# Patient Record
Sex: Male | Born: 1985 | Race: Black or African American | Hispanic: No | Marital: Single | Smoking: Current every day smoker
Health system: Southern US, Community
[De-identification: ages and names within clinical notes are randomized; demographics above are authoritative.]

## PROBLEM LIST (undated history)

## (undated) DIAGNOSIS — Z5900 Homelessness unspecified: Secondary | ICD-10-CM

## (undated) DIAGNOSIS — F209 Schizophrenia, unspecified: Secondary | ICD-10-CM

## (undated) DIAGNOSIS — F319 Bipolar disorder, unspecified: Secondary | ICD-10-CM

---

## 2007-07-03 ENCOUNTER — Emergency Department (HOSPITAL_COMMUNITY): Admission: EM | Admit: 2007-07-03 | Discharge: 2007-07-04 | Payer: Self-pay | Admitting: Emergency Medicine

## 2007-07-18 ENCOUNTER — Emergency Department (HOSPITAL_COMMUNITY): Admission: EM | Admit: 2007-07-18 | Discharge: 2007-07-18 | Payer: Self-pay | Admitting: Emergency Medicine

## 2007-08-09 ENCOUNTER — Emergency Department (HOSPITAL_COMMUNITY): Admission: EM | Admit: 2007-08-09 | Discharge: 2007-08-09 | Payer: Self-pay | Admitting: Emergency Medicine

## 2007-08-14 ENCOUNTER — Emergency Department (HOSPITAL_COMMUNITY): Admission: EM | Admit: 2007-08-14 | Discharge: 2007-08-14 | Payer: Self-pay | Admitting: Emergency Medicine

## 2007-08-27 ENCOUNTER — Emergency Department (HOSPITAL_COMMUNITY): Admission: EM | Admit: 2007-08-27 | Discharge: 2007-08-27 | Payer: Self-pay | Admitting: Emergency Medicine

## 2007-09-10 ENCOUNTER — Emergency Department (HOSPITAL_COMMUNITY): Admission: EM | Admit: 2007-09-10 | Discharge: 2007-09-11 | Payer: Self-pay | Admitting: Emergency Medicine

## 2007-09-19 ENCOUNTER — Emergency Department (HOSPITAL_COMMUNITY): Admission: EM | Admit: 2007-09-19 | Discharge: 2007-09-19 | Payer: Self-pay | Admitting: *Deleted

## 2007-09-20 ENCOUNTER — Emergency Department (HOSPITAL_COMMUNITY): Admission: EM | Admit: 2007-09-20 | Discharge: 2007-09-21 | Payer: Self-pay | Admitting: Emergency Medicine

## 2007-09-30 ENCOUNTER — Emergency Department (HOSPITAL_COMMUNITY): Admission: EM | Admit: 2007-09-30 | Discharge: 2007-10-01 | Payer: Self-pay | Admitting: Emergency Medicine

## 2007-10-11 ENCOUNTER — Emergency Department (HOSPITAL_COMMUNITY): Admission: EM | Admit: 2007-10-11 | Discharge: 2007-10-12 | Payer: Self-pay | Admitting: Emergency Medicine

## 2008-01-07 ENCOUNTER — Emergency Department (HOSPITAL_COMMUNITY): Admission: EM | Admit: 2008-01-07 | Discharge: 2008-01-07 | Payer: Self-pay | Admitting: Emergency Medicine

## 2008-02-13 ENCOUNTER — Emergency Department (HOSPITAL_COMMUNITY): Admission: EM | Admit: 2008-02-13 | Discharge: 2008-02-13 | Payer: Self-pay | Admitting: Emergency Medicine

## 2008-10-06 ENCOUNTER — Emergency Department (HOSPITAL_COMMUNITY): Admission: EM | Admit: 2008-10-06 | Discharge: 2008-10-06 | Payer: Self-pay | Admitting: Emergency Medicine

## 2009-03-02 ENCOUNTER — Emergency Department (HOSPITAL_COMMUNITY): Admission: EM | Admit: 2009-03-02 | Discharge: 2009-03-03 | Payer: Self-pay | Admitting: Emergency Medicine

## 2009-05-22 ENCOUNTER — Emergency Department (HOSPITAL_COMMUNITY): Admission: EM | Admit: 2009-05-22 | Discharge: 2009-05-22 | Payer: Self-pay | Admitting: Emergency Medicine

## 2009-07-05 ENCOUNTER — Emergency Department (HOSPITAL_COMMUNITY): Admission: EM | Admit: 2009-07-05 | Discharge: 2009-07-05 | Payer: Self-pay | Admitting: Emergency Medicine

## 2009-09-15 ENCOUNTER — Emergency Department (HOSPITAL_COMMUNITY): Admission: EM | Admit: 2009-09-15 | Discharge: 2009-09-15 | Payer: Self-pay | Admitting: Emergency Medicine

## 2009-10-02 ENCOUNTER — Emergency Department (HOSPITAL_COMMUNITY): Admission: EM | Admit: 2009-10-02 | Discharge: 2009-10-02 | Payer: Self-pay | Admitting: Emergency Medicine

## 2009-10-29 ENCOUNTER — Emergency Department (HOSPITAL_COMMUNITY): Admission: EM | Admit: 2009-10-29 | Discharge: 2009-10-30 | Payer: Self-pay | Admitting: Emergency Medicine

## 2010-03-21 ENCOUNTER — Emergency Department (HOSPITAL_COMMUNITY)
Admission: EM | Admit: 2010-03-21 | Discharge: 2010-03-22 | Disposition: A | Discharge status: Home / Self Care | Payer: Self-pay | Attending: Emergency Medicine | Admitting: Emergency Medicine

## 2010-03-21 DIAGNOSIS — F209 Schizophrenia, unspecified: Secondary | ICD-10-CM | POA: Insufficient documentation

## 2010-03-21 DIAGNOSIS — L259 Unspecified contact dermatitis, unspecified cause: Secondary | ICD-10-CM | POA: Insufficient documentation

## 2010-03-21 DIAGNOSIS — F909 Attention-deficit hyperactivity disorder, unspecified type: Secondary | ICD-10-CM | POA: Insufficient documentation

## 2010-03-21 DIAGNOSIS — R21 Rash and other nonspecific skin eruption: Secondary | ICD-10-CM | POA: Insufficient documentation

## 2010-03-21 DIAGNOSIS — F319 Bipolar disorder, unspecified: Secondary | ICD-10-CM | POA: Insufficient documentation

## 2010-03-21 DIAGNOSIS — E119 Type 2 diabetes mellitus without complications: Secondary | ICD-10-CM | POA: Insufficient documentation

## 2010-03-23 IMAGING — CR DG CHEST 2V
2 series · 2 of 2 positions shown · non-contrast
Comparison: None available.

CLINICAL DATA: Chest cold.  Medical clearance.

CHEST - 2 VIEW

[w chest pa]
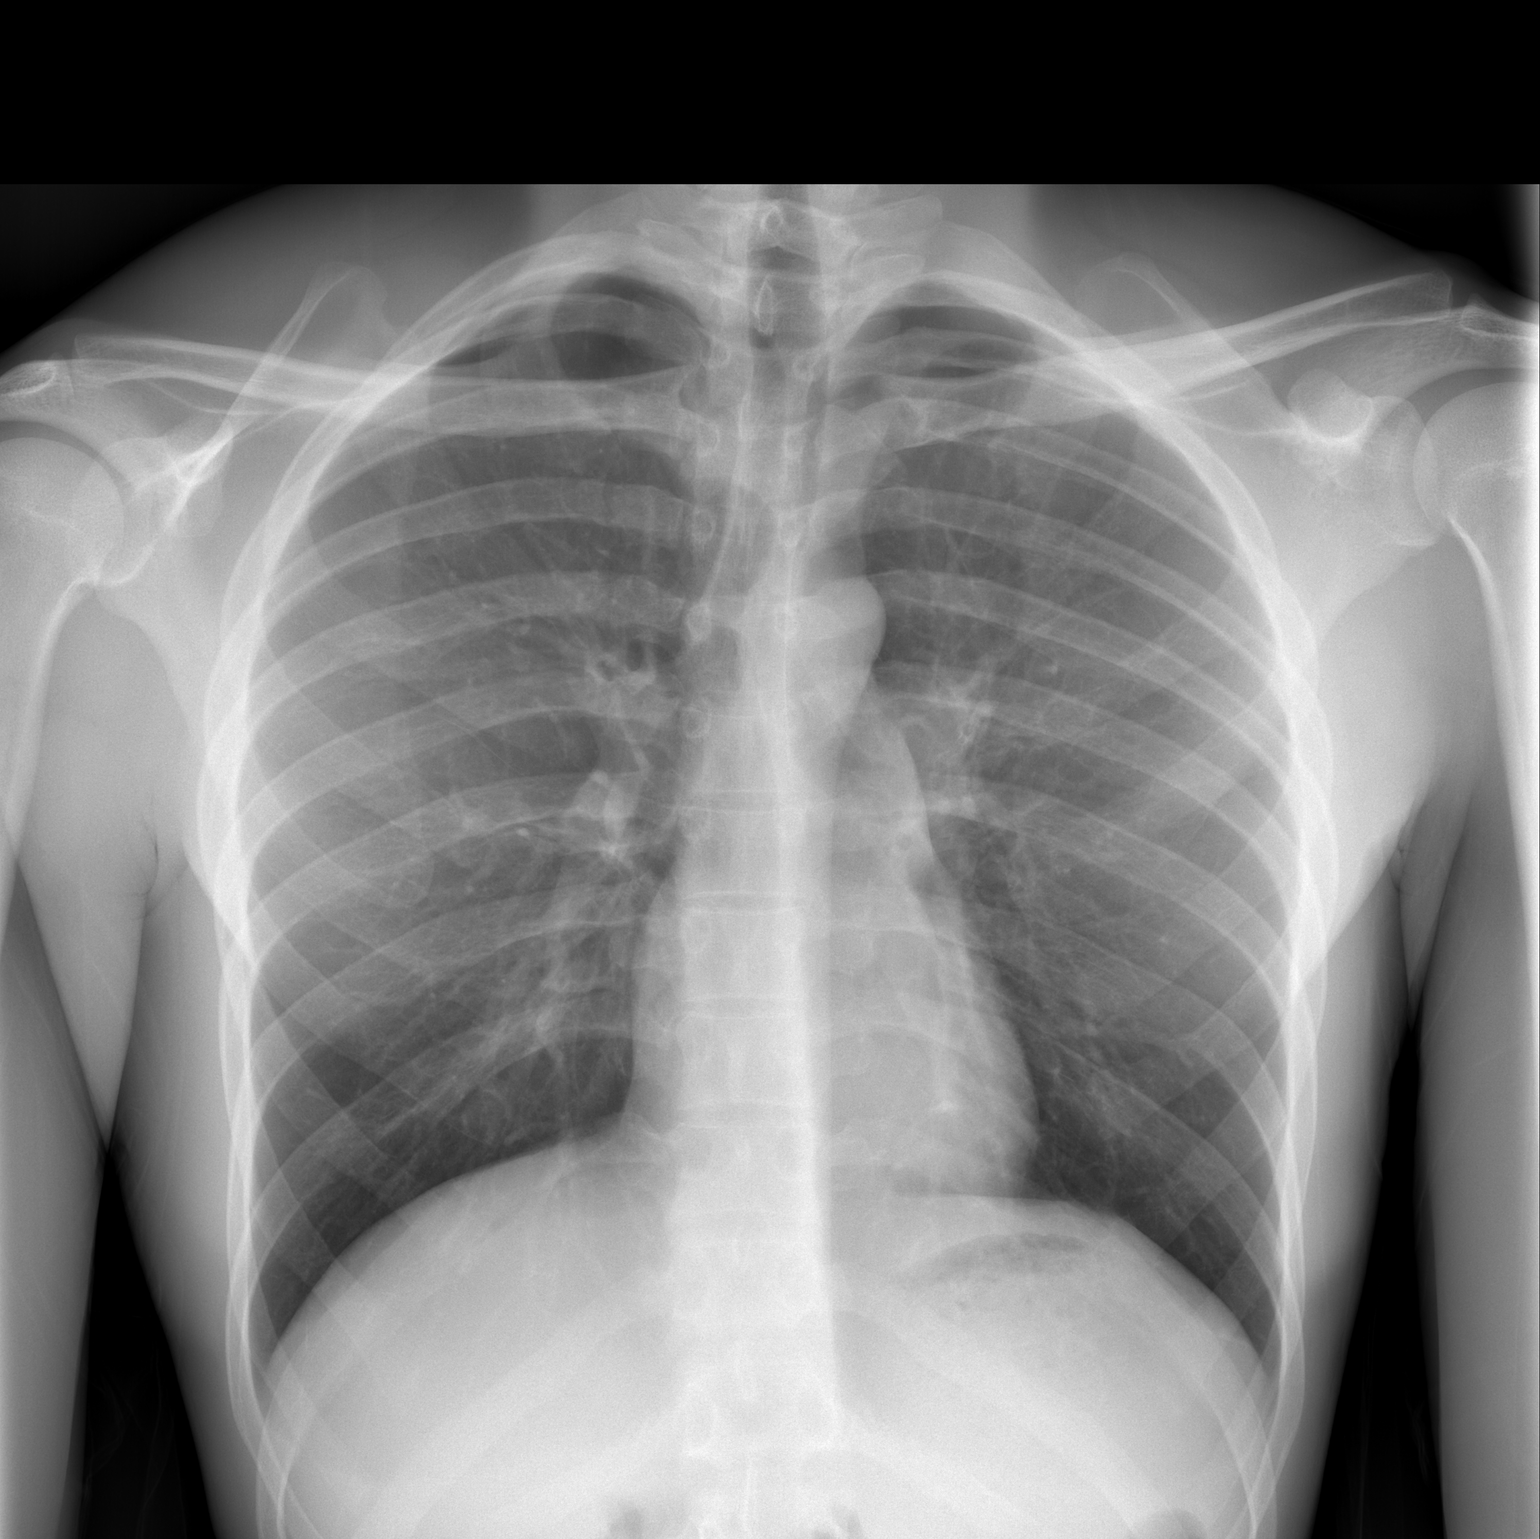

[w chest lat]
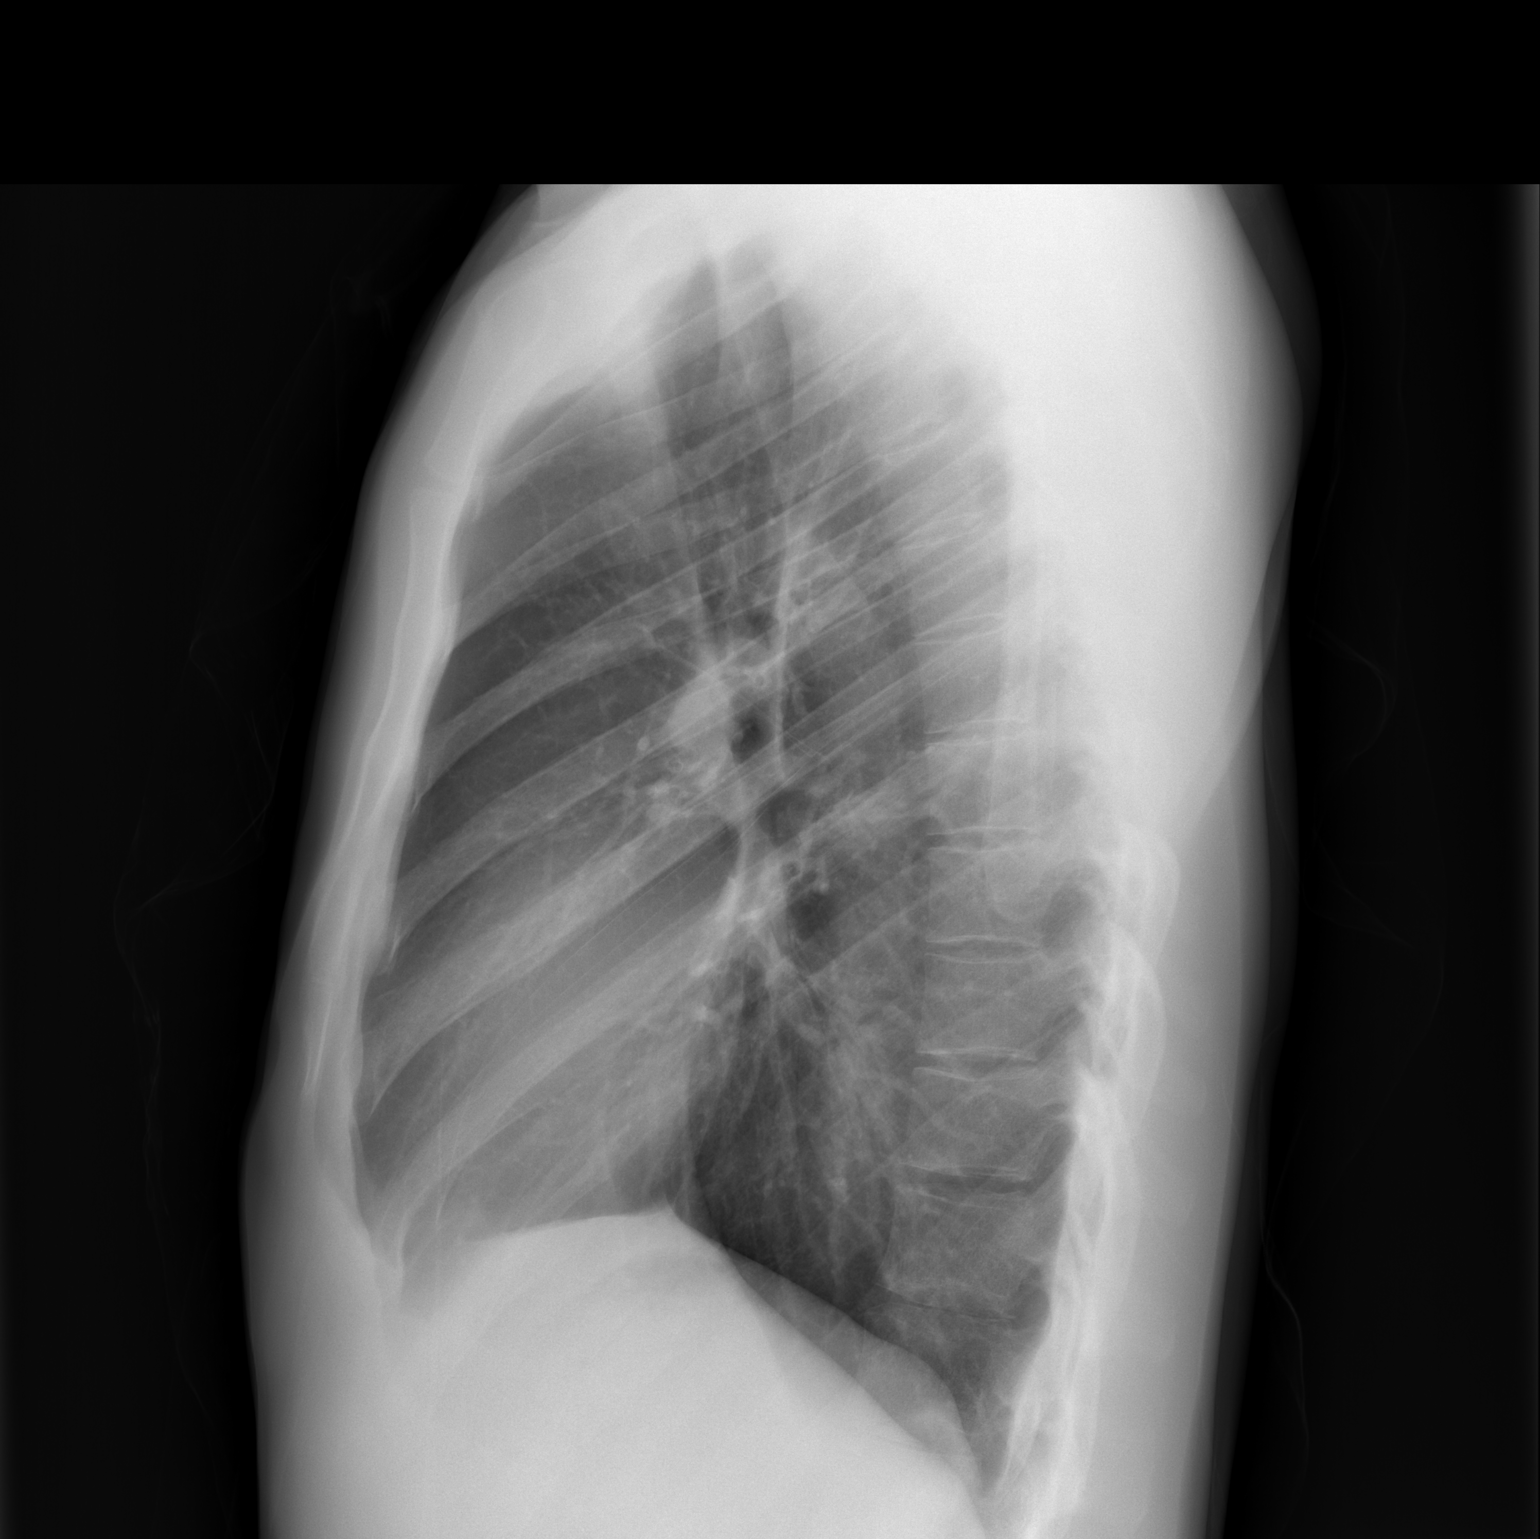

[2 of 2 positions shown; findings below may reference images not displayed]

FINDINGS: Lungs clear.  No pleural effusion.  Heart mediastinum
appear normal.  No bony abnormality.
IMPRESSION: Negative chest.

## 2010-04-18 LAB — GLUCOSE, CAPILLARY: Glucose-Capillary: 82 mg/dL (ref 70–99)

## 2010-04-18 LAB — URINALYSIS, ROUTINE W REFLEX MICROSCOPIC
Ketones, ur: NEGATIVE mg/dL
Specific Gravity, Urine: 1.015 (ref 1.005–1.030)

## 2010-04-22 LAB — GLUCOSE, CAPILLARY: Glucose-Capillary: 81 mg/dL (ref 70–99)

## 2010-10-30 LAB — DIFFERENTIAL
Eosinophils Absolute: 0.1
Eosinophils Relative: 3
Lymphocytes Relative: 16
Lymphs Abs: 0.6 — ABNORMAL LOW
Monocytes Absolute: 0.5
Monocytes Relative: 13 — ABNORMAL HIGH
Neutro Abs: 2.8
Neutrophils Relative %: 67

## 2010-10-30 LAB — CBC
MCHC: 33.5
MCV: 84.7
RDW: 12.8
WBC: 4

## 2010-10-30 LAB — RAPID URINE DRUG SCREEN, HOSP PERFORMED
Amphetamines: NOT DETECTED
Barbiturates: NOT DETECTED
Benzodiazepines: NOT DETECTED
Cocaine: NOT DETECTED

## 2010-10-30 LAB — BASIC METABOLIC PANEL
Chloride: 101
Creatinine, Ser: 1.15

## 2010-10-31 LAB — BASIC METABOLIC PANEL
Creatinine, Ser: 0.97
GFR calc Af Amer: >60
GFR calc non Af Amer: >60
Glucose, Bld: 101 — ABNORMAL HIGH

## 2010-11-07 LAB — GLUCOSE, CAPILLARY: Glucose-Capillary: 106 mg/dL — ABNORMAL HIGH (ref 70–99)

## 2011-02-26 ENCOUNTER — Emergency Department (HOSPITAL_COMMUNITY)
Admission: EM | Admit: 2011-02-26 | Discharge: 2011-02-26 | Disposition: A | Discharge status: Home / Self Care | Payer: Self-pay | Attending: Emergency Medicine | Admitting: Emergency Medicine

## 2011-02-26 ENCOUNTER — Encounter (HOSPITAL_COMMUNITY): Payer: Self-pay

## 2011-02-26 DIAGNOSIS — J069 Acute upper respiratory infection, unspecified: Secondary | ICD-10-CM | POA: Insufficient documentation

## 2011-02-26 DIAGNOSIS — F319 Bipolar disorder, unspecified: Secondary | ICD-10-CM | POA: Insufficient documentation

## 2011-02-26 DIAGNOSIS — J3489 Other specified disorders of nose and nasal sinuses: Secondary | ICD-10-CM | POA: Insufficient documentation

## 2011-02-26 DIAGNOSIS — R05 Cough: Secondary | ICD-10-CM | POA: Insufficient documentation

## 2011-02-26 DIAGNOSIS — Z8659 Personal history of other mental and behavioral disorders: Secondary | ICD-10-CM | POA: Insufficient documentation

## 2011-02-26 DIAGNOSIS — R059 Cough, unspecified: Secondary | ICD-10-CM | POA: Insufficient documentation

## 2011-02-26 DIAGNOSIS — M25519 Pain in unspecified shoulder: Secondary | ICD-10-CM | POA: Insufficient documentation

## 2011-02-26 DIAGNOSIS — E119 Type 2 diabetes mellitus without complications: Secondary | ICD-10-CM | POA: Insufficient documentation

## 2011-02-26 DIAGNOSIS — R6889 Other general symptoms and signs: Secondary | ICD-10-CM | POA: Insufficient documentation

## 2011-02-26 HISTORY — DX: Schizophrenia, unspecified: F20.9

## 2011-02-26 HISTORY — DX: Bipolar disorder, unspecified: F31.9

## 2011-02-26 MED ORDER — TRAMADOL HCL 50 MG PO TABS: 50.0000 mg | ORAL_TABLET | Freq: Four times a day (QID) | ORAL | Status: AC | PRN

## 2011-02-26 NOTE — ED Notes (Signed)
Pt also reports L clavicle and sternal pain "all my life"  Pt reports bones were fractured "a long time ago" but does not  Remember injury.

## 2011-02-26 NOTE — ED Notes (Signed)
Pt presents with 2 week h/o productive cough with yellow phlegm.  Pt reports nasal congestion with same.  -sore throat

## 2011-02-26 NOTE — ED Provider Notes (Signed)
History     CSN: [id number]  Arrival date & time 02/26/11  1055   First MD Initiated Contact with Patient 02/26/11 1128      Chief Complaint  Patient presents with  . Cough    (Consider location/radiation/quality/duration/timing/severity/associated sxs/prior treatment) HPI Comments: Patient presents with a one to two-week history of runny nose and nasal congestion. He says he has a mild cough but nonproductive denies any chest congestion. Denies a fevers or chills denies any difficulty swallowing. Denies a nausea vomiting. He has not taken anything for this in the past. He'll soak complains of pain to his left clavicle and sternum. He says he fractured this is a small child and is chronically in pain. Denies any recent injuries to this. Denies any shortness of breath or other chest pain.  Patient is a 26 y.o. male presenting with cough. The history is provided by the patient.  Cough Associated symptoms include rhinorrhea. Pertinent negatives include no chest pain, no chills, no headaches and no shortness of breath.    Past Medical History  Diagnosis Date  . Diabetes mellitus   . Bipolar 1 disorder   . Schizophrenic disorder     History reviewed. No pertinent past surgical history.  No family history on file.  History  Substance Use Topics  . Smoking status: Current Everyday Smoker -- 1.0 packs/day  . Smokeless tobacco: Not on file  . Alcohol Use: Yes      Review of Systems  Constitutional: Negative for fever, chills, diaphoresis and fatigue.  HENT: Positive for congestion and rhinorrhea. Negative for sneezing.   Eyes: Negative.   Respiratory: Positive for cough. Negative for chest tightness and shortness of breath.   Cardiovascular: Negative for chest pain and leg swelling.  Gastrointestinal: Negative for nausea, vomiting, abdominal pain, diarrhea and blood in stool.  Genitourinary: Negative for frequency, hematuria, flank pain and difficulty urinating.    Musculoskeletal: Negative for back pain and arthralgias.  Skin: Negative for rash.  Neurological: Negative for dizziness, speech difficulty, weakness, numbness and headaches.    Allergies  Penicillins cross reactors  Home Medications   Current Outpatient Rx  Name Route Sig Dispense Refill  . TRAMADOL HCL 50 MG PO TABS Oral Take 1 tablet (50 mg total) by mouth every 6 (six) hours as needed for pain. 15 tablet 0    BP 158/63  Pulse 86  Temp(Src) 97.6 F (36.4 C) (Oral)  Resp 20  SpO2 99%  Physical Exam  Constitutional: He is oriented to person, place, and time. He appears well-developed and well-nourished.  HENT:  Head: Normocephalic and atraumatic.  Eyes: Pupils are equal, round, and reactive to light.  Neck: Normal range of motion. Neck supple.  Cardiovascular: Normal rate, regular rhythm and normal heart sounds.   Pulmonary/Chest: Effort normal and breath sounds normal. No respiratory distress. He has no wheezes. He has no rales. He exhibits no tenderness.       Mild deformity to the left proximal clavicle area. No swelling or erythema is noted this area  Abdominal: Soft. Bowel sounds are normal. There is no tenderness. There is no rebound and no guarding.  Musculoskeletal: Normal range of motion. He exhibits no edema.  Lymphadenopathy:    He has no cervical adenopathy.  Neurological: He is alert and oriented to person, place, and time.  Skin: Skin is warm and dry. No rash noted.  Psychiatric: He has a normal mood and affect.    ED Course  Procedures (including critical care time)  Labs Reviewed - No data to display No results found.   1. URI (upper respiratory infection)       MDM  Patient with URI symptoms. No evidence of pneumonia. Has chronic pain to his left clavicle. Will refer him to [facility]. We'll give him a short course of Ultram.        Rolan Bucco, MD 02/26/11 1135

## 2012-08-22 ENCOUNTER — Encounter (HOSPITAL_COMMUNITY): Payer: Self-pay | Admitting: *Deleted

## 2012-08-22 ENCOUNTER — Emergency Department (HOSPITAL_COMMUNITY): Payer: Self-pay

## 2012-08-22 ENCOUNTER — Inpatient Hospital Stay (HOSPITAL_COMMUNITY)
Admission: EM | Admit: 2012-08-22 | Discharge: 2012-08-23 | DRG: 894 | Discharge status: Against Medical Advice | Payer: MEDICAID | Attending: Internal Medicine | Admitting: Internal Medicine

## 2012-08-22 DIAGNOSIS — I498 Other specified cardiac arrhythmias: Secondary | ICD-10-CM | POA: Diagnosis present

## 2012-08-22 DIAGNOSIS — E119 Type 2 diabetes mellitus without complications: Secondary | ICD-10-CM | POA: Diagnosis present

## 2012-08-22 DIAGNOSIS — F17200 Nicotine dependence, unspecified, uncomplicated: Secondary | ICD-10-CM | POA: Diagnosis present

## 2012-08-22 DIAGNOSIS — R4182 Altered mental status, unspecified: Secondary | ICD-10-CM

## 2012-08-22 DIAGNOSIS — F1910 Other psychoactive substance abuse, uncomplicated: Secondary | ICD-10-CM

## 2012-08-22 DIAGNOSIS — R7989 Other specified abnormal findings of blood chemistry: Secondary | ICD-10-CM

## 2012-08-22 DIAGNOSIS — F1410 Cocaine abuse, uncomplicated: Principal | ICD-10-CM

## 2012-08-22 DIAGNOSIS — F319 Bipolar disorder, unspecified: Secondary | ICD-10-CM | POA: Diagnosis present

## 2012-08-22 DIAGNOSIS — F209 Schizophrenia, unspecified: Secondary | ICD-10-CM | POA: Diagnosis present

## 2012-08-22 DIAGNOSIS — I440 Atrioventricular block, first degree: Secondary | ICD-10-CM | POA: Diagnosis present

## 2012-08-22 DIAGNOSIS — Z5900 Homelessness unspecified: Secondary | ICD-10-CM

## 2012-08-22 DIAGNOSIS — F1210 Cannabis abuse, uncomplicated: Secondary | ICD-10-CM | POA: Diagnosis present

## 2012-08-22 DIAGNOSIS — I201 Angina pectoris with documented spasm: Secondary | ICD-10-CM | POA: Diagnosis present

## 2012-08-22 LAB — COMPREHENSIVE METABOLIC PANEL
ALT: 24 U/L (ref 0–53)
CO2: 28 mEq/L (ref 19–32)
Calcium: 8.7 mg/dL (ref 8.4–10.5)
Chloride: 107 mEq/L (ref 96–112)
Creatinine, Ser: 1.24 mg/dL (ref 0.50–1.35)
GFR calc Af Amer: >90 mL/min (ref >90)
Glucose, Bld: 92 mg/dL (ref 70–99)
Potassium: 3.7 mEq/L (ref 3.5–5.1)
Sodium: 141 mEq/L (ref 135–145)
Total Bilirubin: 0.2 mg/dL — ABNORMAL LOW (ref 0.3–1.2)
Total Protein: 6 g/dL (ref 6.0–8.3)

## 2012-08-22 LAB — URINE MICROSCOPIC-ADD ON

## 2012-08-22 LAB — TROPONIN I: Troponin I: 0.44 ng/mL (ref <0.30)

## 2012-08-22 LAB — CBC WITH DIFFERENTIAL/PLATELET
Basophils Relative: 0 % (ref 0–1)
Eosinophils Absolute: 0.1 10*3/uL (ref 0.0–0.7)
Lymphs Abs: 1.1 10*3/uL (ref 0.7–4.0)
MCV: 87.2 fL (ref 78.0–100.0)
Neutrophils Relative %: 70 % (ref 43–77)
RBC: 3.83 MIL/uL — ABNORMAL LOW (ref 4.22–5.81)

## 2012-08-22 LAB — ETHANOL: Alcohol, Ethyl (B): <11 mg/dL (ref 0–11)

## 2012-08-22 LAB — URINALYSIS, ROUTINE W REFLEX MICROSCOPIC
Glucose, UA: NEGATIVE mg/dL
Leukocytes, UA: NEGATIVE
Nitrite: NEGATIVE
Specific Gravity, Urine: 1.031 — ABNORMAL HIGH (ref 1.005–1.030)
pH: 6.5 (ref 5.0–8.0)

## 2012-08-22 LAB — RAPID URINE DRUG SCREEN, HOSP PERFORMED
Cocaine: POSITIVE — AB
Tetrahydrocannabinol: POSITIVE — AB

## 2012-08-22 LAB — SALICYLATE LEVEL: Salicylate Lvl: <2 mg/dL — ABNORMAL LOW (ref 2.8–20.0)

## 2012-08-22 LAB — ACETAMINOPHEN LEVEL: Acetaminophen (Tylenol), Serum: <15 ug/mL (ref 10–30)

## 2012-08-22 MED ORDER — ASPIRIN 300 MG RE SUPP
300.0000 mg | RECTAL | Status: AC
  Administered 2012-08-22: 300 mg via RECTAL
  Filled 2012-08-22: qty 1

## 2012-08-22 MED ORDER — NALOXONE HCL 0.4 MG/ML IJ SOLN
0.4000 mg | Freq: Once | INTRAMUSCULAR | Status: AC
  Administered 2012-08-22: 0.4 mg via INTRAVENOUS
  Filled 2012-08-22: qty 1

## 2012-08-22 MED ORDER — SODIUM CHLORIDE 0.9 % IV SOLN
INTRAVENOUS | Status: AC
  Administered 2012-08-22: 23:00:00 via INTRAVENOUS

## 2012-08-22 MED ORDER — SODIUM CHLORIDE 0.9 % IJ SOLN: 3.0000 mL | Freq: Two times a day (BID) | INTRAMUSCULAR | Status: DC

## 2012-08-22 MED ORDER — ASPIRIN 325 MG PO TABS
325.0000 mg | ORAL_TABLET | Freq: Every day | ORAL | Status: DC
  Filled 2012-08-22: qty 1

## 2012-08-22 MED ORDER — SODIUM CHLORIDE 0.9 % IV SOLN
INTRAVENOUS | Status: DC
  Administered 2012-08-23: 07:00:00 via INTRAVENOUS

## 2012-08-22 MED ORDER — HEPARIN SODIUM (PORCINE) 5000 UNIT/ML IJ SOLN
5000.0000 [IU] | Freq: Three times a day (TID) | INTRAMUSCULAR | Status: DC
  Administered 2012-08-22 – 2012-08-23 (×2): 5000 [IU] via SUBCUTANEOUS
  Filled 2012-08-22 (×5): qty 1

## 2012-08-22 MED ORDER — SODIUM CHLORIDE 0.9 % IV BOLUS (SEPSIS)
1000.0000 mL | Freq: Once | INTRAVENOUS | Status: AC
  Administered 2012-08-22: 1000 mL via INTRAVENOUS

## 2012-08-22 NOTE — H&P (Addendum)
[facility] History and Physical  Stephen Hutchinson WUJ:[id number] DOB: 05-Apr-1985 DOA: 08/22/2012  Referring physician: ED PCP: Default, Provider, MD   Chief Complaint: AMS  HPI: Stephen Hutchinson is a 27 y.o. male who presents to the ED with AMS.  He has a history of BPD and schizophrenia as well as polysubstance abuse.  He is homeless, was on the side of the street when EMS picked him up.  He told EMS that he had some headache but appeared to be very intoxicated.  Due to intoxication he is not able to give much more history at this time.  In the ED labs were remarkable for an elevated troponin on the i-stat, confirmed by lab draws.  Remainder of his work up was remarkable for positive THC and cocaine in the urine, and otherwise unremarkable.  Narcan x2 did not improve mental status.  Hospitalist has been asked to admit.  Review of Systems: Patient denies SOB, denies CP, just states he feels sleepy "because he hasnt slept in a month", very difficult to get much more out of the patient in ROS at this point.  Past Medical History  Diagnosis Date  . Diabetes mellitus   . Bipolar 1 disorder   . Schizophrenic disorder    History reviewed. No pertinent past surgical history. Social History:  reports that he has been smoking.  He does not have any smokeless tobacco history on file. He reports that  drinks alcohol. He reports that he does not use illicit drugs.   Allergies  Allergen Reactions  . Penicillins Cross Reactors Swelling    History reviewed. No pertinent family history.   Prior to Admission medications   Not on File   Physical Exam: Filed Vitals:   08/22/12 1853  BP: 123/80  Pulse: 60  Temp: 98.5 F (36.9 C)  TempSrc: Oral  Resp: 16  SpO2: 100%    General:  NAD, sleepy, wakes up to voice but falls back asleep very quickly Eyes: PEERLA EOMI ENT: mucous membranes moist Neck: supple w/o JVD Cardiovascular: RRR w/o MRG Respiratory: CTA B Abdomen: soft, nt,  nd, bs+ Skin: no rash nor lesion Musculoskeletal: MAE, full ROM all 4 extremities Psychiatric: normal tone and affect Neurologic: AAOx3, grossly non-focal   Labs on Admission:  Basic Metabolic Panel:  Recent Labs Lab 08/22/12 1930  NA 141  K 3.7  CL 107  CO2 28  GLUCOSE 92  BUN 9  CREATININE 1.24  CALCIUM 8.7   Liver Function Tests:  Recent Labs Lab 08/22/12 1930  AST 33  ALT 24  ALKPHOS 57  BILITOT 0.2*  PROT 6.0  ALBUMIN 3.3*   No results found for this basename: LIPASE, AMYLASE,  in the last 168 hours No results found for this basename: AMMONIA,  in the last 168 hours CBC:  Recent Labs Lab 08/22/12 1930  WBC 6.0  NEUTROABS 4.2  HGB 10.6*  HCT 33.4*  MCV 87.2  PLT 186   Cardiac Enzymes:  Recent Labs Lab 08/22/12 1930  TROPONINI 0.44*    BNP (last 3 results) No results found for this basename: PROBNP,  in the last 8760 hours CBG: No results found for this basename: GLUCAP,  in the last 168 hours  Radiological Exams on Admission: Dg Chest 1 View  08/22/2012   *RADIOLOGY REPORT*  Clinical Data: Altered mental status.  Diabetes.  CHEST - 1 VIEW  Comparison: 09/30/2007.  Findings:  Cardiopericardial silhouette within normal limits. Mediastinal contours normal. Trachea midline.  No airspace disease  or effusion. Monitoring leads are projected over the chest.  IMPRESSION: No active cardiopulmonary disease.   Original Report Authenticated By: Andreas Newport, M.D.   Ct Head Wo Contrast  08/22/2012   *RADIOLOGY REPORT*  Clinical Data: Altered mental status  CT HEAD WITHOUT CONTRAST  Technique:  Contiguous axial images were obtained from the base of the skull through the vertex without contrast. Study was obtained within 24 hours of patient arrival at the emergency department.  Comparison: December 10, 2007  Findings: Ventricles are normal in size and configuration.  There is no mass, hemorrhage, extra-axial fluid collection, or midline shift.  The gray-white  compartments are normal.  No acute infarct apparent.  Bony calvarium appears intact.  The mastoid air cells are clear.  There is minimal mucosal thickening in both maxillary antra.  IMPRESSION: Minimal maxillary sinus disease bilaterally.  Study otherwise unremarkable.   Original Report Authenticated By: Bretta Bang, M.D.    EKG: Independently reviewed.  Assessment/Plan Principal Problem:   Elevated troponin Active Problems:   Polysubstance abuse   Cocaine abuse   Altered mental status   1. Elevated troponin - checking serial troponins, admitting patient over to [facility], cardiology consulted and will see patient later when he gets over to [facility], likely secondary to ischemia from cocaine use.  Clinically patient states he isnt having SOB or CP at this time (though he does admit he gets SOB "sometimes"), 2D echo ordered, ASA 325 ordered, no heparin per Cards, no beta blocker since he is on cocaine, lipid panel ordered for tomorrow AM to see if statin indicated.  No CP at this time so wont order NTG.  Likewise dissection unlikely in absence of CP and normal BPs. 2. AMS - likely secondary to polysubstance abuse, unclear however which substance is causing the AMS, the AMS findings are almost classic for an opiod, but this was negative in his UDS and patient had no response to narcan given twice in ED.  CT head negative, will observe patient for now and see if this resolves overnight.  Patient states he is tired because he "hasnt slept in a month" I suppose that this is also possible given his h/o BPD type 1. 3. Question of DM - I see this on patients PMH but dont see that he is on ANY home meds for this, additionally his BGL has never been elevated on labs here in the past, have ordered Q4H BGL checks for now in the SDU and will add insulin coverage if he elevates his BGL.    Code Status: Full Code (must indicate code status--if unknown or must be presumed, indicate so) Family Communication: No  family in room (indicate person spoken with, if applicable, with phone number if by telephone) Disposition Plan: Admit to inpatient (indicate anticipated LOS)  Time spent: 70 min  GARDNER, JARED M. [facility] Pager [phone]  If 7PM-7AM, please contact night-coverage [email or web address] Password [facility] 08/22/2012, 11:12 PM

## 2012-08-22 NOTE — ED Provider Notes (Signed)
History    CSN: [id number] Arrival date & time 08/22/12  1847  First MD Initiated Contact with Patient 08/22/12 1851     Chief Complaint  Patient presents with  . Headache   (Consider location/radiation/quality/duration/timing/severity/associated sxs/prior Treatment) The history is provided by the patient and the EMS personnel. History limited by: intoxication.  Badr Piedra is a 27 y.o. male hx of DM, bipolar here with AMS. He is homeless and was on the side of the street. He told EMS that he had some headache but appeared very intoxicated. Denies alcohol use but smokes and does some drugs but is unable to say what drugs he took. He is unable to characterize his headache since he is intoxicated.   Level V caveat- AMS Past Medical History  Diagnosis Date  . Diabetes mellitus   . Bipolar 1 disorder   . Schizophrenic disorder    History reviewed. No pertinent past surgical history. History reviewed. No pertinent family history. History  Substance Use Topics  . Smoking status: Current Every Day Smoker -- 1.00 packs/day  . Smokeless tobacco: Not on file  . Alcohol Use: Yes    Review of Systems  Unable to perform ROS: Mental status change  Neurological: Positive for headaches.    Allergies  Penicillins cross reactors  Home Medications  No current outpatient prescriptions on file. BP 123/80  Pulse 60  Temp(Src) 98.5 F (36.9 C) (Oral)  Resp 16  SpO2 100% Physical Exam  Nursing note and vitals reviewed. Constitutional:  Tired, wakes up to answer questions. Smells of alcohol   HENT:  Head: Normocephalic and atraumatic.  Mouth/Throat: Oropharynx is clear and moist.  Eyes: Pupils are equal, round, and reactive to light.  Pupils small but reactive. EOMI  Neck: Normal range of motion. Neck supple.  Cardiovascular: Normal rate, regular rhythm and normal heart sounds.   Pulmonary/Chest: Effort normal and breath sounds normal. No respiratory distress. He has no  wheezes. He has no rales.  Abdominal: Soft. Bowel sounds are normal. He exhibits no distension. There is no tenderness. There is no rebound and no guarding.  Musculoskeletal: Normal range of motion.  Neurological:  Tired, wakes up to questioning. Moving all extremities. Difficult to follow commands   Skin: Skin is warm and dry.  Psychiatric:  Unable     ED Course  Procedures (including critical care time)  CRITICAL CARE Performed by: Silverio Lay, DAVID   Total critical care time:  30 min   Critical care time was exclusive of separately billable procedures and treating other patients.  Critical care was necessary to treat or prevent imminent or life-threatening deterioration.  Critical care was time spent personally by me on the following activities: development of treatment plan with patient and/or surrogate as well as nursing, discussions with consultants, evaluation of patient's response to treatment, examination of patient, obtaining history from patient or surrogate, ordering and performing treatments and interventions, ordering and review of laboratory studies, ordering and review of radiographic studies, pulse oximetry and re-evaluation of patient's condition.   Labs Reviewed  CBC WITH DIFFERENTIAL - Abnormal; Notable for the following:    RBC 3.83 (*)    Hemoglobin 10.6 (*)    HCT 33.4 (*)    All other components within normal limits  COMPREHENSIVE METABOLIC PANEL - Abnormal; Notable for the following:    Albumin 3.3 (*)    Total Bilirubin 0.2 (*)    GFR calc non Af Amer 78 (*)    All other components within normal  limits  SALICYLATE LEVEL - Abnormal; Notable for the following:    Salicylate Lvl <2.0 (*)    All other components within normal limits  URINE RAPID DRUG SCREEN (HOSP PERFORMED) - Abnormal; Notable for the following:    Cocaine POSITIVE (*)    Tetrahydrocannabinol POSITIVE (*)    All other components within normal limits  TROPONIN I - Abnormal; Notable for the  following:    Troponin I 0.44 (*)    All other components within normal limits  URINALYSIS, ROUTINE W REFLEX MICROSCOPIC - Abnormal; Notable for the following:    APPearance CLOUDY (*)    Specific Gravity, Urine 1.031 (*)    Protein, ur 30 (*)    All other components within normal limits  URINE MICROSCOPIC-ADD ON - Abnormal; Notable for the following:    Bacteria, UA FEW (*)    Casts HYALINE CASTS (*)    Crystals CA OXALATE CRYSTALS (*)    All other components within normal limits  POCT I-STAT TROPONIN I - Abnormal; Notable for the following:    Troponin i, poc 0.16 (*)    All other components within normal limits  ETHANOL  ACETAMINOPHEN LEVEL   Dg Chest 1 View  08/22/2012   *RADIOLOGY REPORT*  Clinical Data: Altered mental status.  Diabetes.  CHEST - 1 VIEW  Comparison: 09/30/2007.  Findings:  Cardiopericardial silhouette within normal limits. Mediastinal contours normal. Trachea midline.  No airspace disease or effusion. Monitoring leads are projected over the chest.  IMPRESSION: No active cardiopulmonary disease.   Original Report Authenticated By: Andreas Newport, M.D.   Ct Head Wo Contrast  08/22/2012   *RADIOLOGY REPORT*  Clinical Data: Altered mental status  CT HEAD WITHOUT CONTRAST  Technique:  Contiguous axial images were obtained from the base of the skull through the vertex without contrast. Study was obtained within 24 hours of patient arrival at the emergency department.  Comparison: December 10, 2007  Findings: Ventricles are normal in size and configuration.  There is no mass, hemorrhage, extra-axial fluid collection, or midline shift.  The gray-white compartments are normal.  No acute infarct apparent.  Bony calvarium appears intact.  The mastoid air cells are clear.  There is minimal mucosal thickening in both maxillary antra.  IMPRESSION: Minimal maxillary sinus disease bilaterally.  Study otherwise unremarkable.   Original Report Authenticated By: Bretta Bang, M.D.   No  diagnosis found.   Date: 08/22/2012  Rate: 67  Rhythm: sinus arrhythmia  QRS Axis: normal  Intervals: normal  ST/T Wave abnormalities: nonspecific ST changes  Conduction Disutrbances:none  Narrative Interpretation:   Old EKG Reviewed: none available   MDM  Neiman Minix is a 27 y.o. male here with AMS. Likely intoxicated. Will get labs and tox level. Will give narcan and reassess. He is protecting his airway currently and not drooling.   9:42 PM Patient still altered. istat trop positive. Lab trop also positive. I called Dr. Tresa Endo from [place], who recommend toxic metabolic workup. Patient denies chest pain so no need for urgent cath. Patient admitted to medicine for positive trop and AMS. He is also positive for cocaine. CT head showed no bleed. Cardiology recommend not starting heparin for now.     Richardean Canal, MD 08/22/12 2146

## 2012-08-22 NOTE — ED Notes (Signed)
[id number]<WG> Expected date:<BR> Expected time:<BR> Means of arrival:<BR> Comments:<BR> ems- hypotensive

## 2012-08-22 NOTE — ED Notes (Signed)
Stephen Hutchinson EDP made aware of i-stat trop.results

## 2012-08-22 NOTE — ED Notes (Signed)
Per EMS pt presents to ed with c/o headache, per EMS pt appears intoxicated, drooling, lethargic.

## 2012-08-22 NOTE — ED Notes (Signed)
PT RECEIVED A BED@2311  NURSE WAS NOTIFIED.

## 2012-08-22 NOTE — ED Notes (Signed)
Pt appears lethargic, drowsy, responding to verbal stimuli, denies drinking or recreational drugs use, sts having right arm pain. Narcan administered as ordered, no change in LOC afterward

## 2012-08-23 DIAGNOSIS — R4182 Altered mental status, unspecified: Secondary | ICD-10-CM

## 2012-08-23 DIAGNOSIS — R7881 Bacteremia: Secondary | ICD-10-CM

## 2012-08-23 DIAGNOSIS — F1910 Other psychoactive substance abuse, uncomplicated: Secondary | ICD-10-CM

## 2012-08-23 DIAGNOSIS — F1410 Cocaine abuse, uncomplicated: Secondary | ICD-10-CM

## 2012-08-23 LAB — GLUCOSE, CAPILLARY: Glucose-Capillary: 79 mg/dL (ref 70–99)

## 2012-08-23 LAB — TROPONIN I: Troponin I: <0.3 ng/mL (ref <0.30)

## 2012-08-23 MED ORDER — ASPIRIN 325 MG PO TABS
325.0000 mg | ORAL_TABLET | Freq: Every day | ORAL | Status: DC
  Filled 2012-08-23: qty 1

## 2012-08-23 NOTE — Progress Notes (Signed)
Received pt in 1915 from [facility] via [facility]. Pt responds to repeated verbal stimuli. Knows that he is in the hospital but does not know which one. On 2L O2 per [place].Caron Tardif Seromines

## 2012-08-23 NOTE — Progress Notes (Signed)
   Subjective:  This am he is still very lethargic.  Denies chest pain.  Objective:  Vital Signs in the last 24 hours: Temp:  [98.4 F (36.9 C)-98.5 F (36.9 C)] 98.4 F (36.9 C) (07/22 0130) Pulse Rate:  [48-83] 83 (07/22 0330) Resp:  [9-21] 19 (07/22 0330) BP: (101-135)/(66-95) 135/84 mmHg (07/22 0330) SpO2:  [89 %-100 %] 100 % (07/22 0330)  Intake/Output from previous day: 07/21 0701 - 07/22 0700 In: 150 [I.V.:150] Out: -  Intake/Output from this shift:    . sodium chloride   Intravenous STAT  . heparin  5,000 Units Subcutaneous Q8H  . sodium chloride  3 mL Intravenous Q12H   . sodium chloride 75 mL/hr at 08/23/12 1610    Physical Exam: The patient appears to be in no distress.  Head and neck exam reveals that the pupils are equal and reactive.  The extraocular movements are full.  There is no scleral icterus.  Mouth and pharynx are benign.  No lymphadenopathy.  No carotid bruits.  The jugular venous pressure is normal.  Thyroid is not enlarged or tender.  Chest is clear to percussion and auscultation.  No rales or rhonchi.  Expansion of the chest is symmetrical.  Heart reveals no abnormal lift or heave.  First and second heart sounds are normal.  There is no murmur gallop rub or click.  The abdomen is soft and nontender.  Bowel sounds are normoactive.  There is no hepatosplenomegaly or mass.  There are no abdominal bruits.  Extremities reveal no phlebitis or edema.  Pedal pulses are good.  There is no cyanosis or clubbing.  Neurologic exam is normal strength and no lateralizing weakness.  No sensory deficits.  Integument reveals no rash  Lab Results:  Recent Labs  08/22/12 1930  WBC 6.0  HGB 10.6*  PLT 186    Recent Labs  08/22/12 1930  NA 141  K 3.7  CL 107  CO2 28  GLUCOSE 92  BUN 9  CREATININE 1.24    Recent Labs  08/22/12 1930 08/23/12 0005  TROPONINI 0.44* <0.30   Hepatic Function Panel  Recent Labs  08/22/12 1930  PROT 6.0    ALBUMIN 3.3*  AST 33  ALT 24  ALKPHOS 57  BILITOT 0.2*   No results found for this basename: CHOL,  in the last 72 hours No results found for this basename: PROTIME,  in the last 72 hours  Imaging: Imaging results have been reviewed  Cardiac Studies: EKG shows sinus bradycardia.  No ischemic changes. Assessment/Plan:  1.  Cocaine abuse with transient slight bump in troponin, subsequent troponin negative. 2. Schizophrenia 3. Polysubstance abuse. 4. Bipolar disorder.  Plan: Continue ASA. No further cardiac studies needed. Drug counseling.  LOS: 1 day    Cassell Clement 08/23/2012, 7:31 AM

## 2012-08-23 NOTE — Consult Note (Signed)
Reason for Consult: slightly elevated troponin and cocaine use Referring Physician: Dr. Silverio Hutchinson, Dr. Henreitta Hutchinson is an 27 y.o. male.  HPI: Stephen Hutchinson is a 27 yo man with PMH of bipolar disease and schizophrenia and polysubstance abuse who is homeless and found by EMS on side of street. He initially complained of headache to EMS and appeared lethargic and intoxicated to the paramedics. In the ER he was lethargic, clinically called altered mental status and i-stat troponin drawn which was vaguely positive with repeat troponin slightly elevated and following troponin negative. Urinalysis also revealed marijuana and cocaine int he urine. Narcan was used twice without presentation. He was seen at [facility] and the hospitalist team transferred him to [facility] and asked cardiology to consult on the troponin elevation. He denies chest pain and SOB but I cannot obtain full ROS or history given his drowsiness. He is arousable to sternal rub and tells me he's in a hospital, I'm a doctor, he's from everywhere.    Past Medical History  Diagnosis Date  . Diabetes mellitus   . Bipolar 1 disorder   . Schizophrenic disorder     History reviewed. No pertinent past surgical history.  History reviewed. No pertinent family history.  Social History:  reports that he has been smoking.  He does not have any smokeless tobacco history on file. He reports that  drinks alcohol. He reports that he does not use illicit drugs.  Allergies:  Allergies  Allergen Reactions  . Penicillins Cross Reactors Swelling    Medications:  I have reviewed the patient's current medications. Prior to Admission:  (Not in a hospital admission) Scheduled: . heparin  5,000 Units Subcutaneous Q8H  . sodium chloride  3 mL Intravenous Q12H    Results for orders placed during the hospital encounter of 08/22/12 (from the past 48 hour(s))  CBC WITH DIFFERENTIAL     Status: Abnormal   Collection Time    08/22/12  7:30 PM       Result Value Range   WBC 6.0  4.0 - 10.5 K/uL   RBC 3.83 (*) 4.22 - 5.81 MIL/uL   Hemoglobin 10.6 (*) 13.0 - 17.0 g/dL   HCT 16.1 (*) 09.6 - 04.5 %   MCV 87.2  78.0 - 100.0 fL   MCH 27.7  26.0 - 34.0 pg   MCHC 31.7  30.0 - 36.0 g/dL   RDW 40.9  81.1 - 91.4 %   Platelets 186  150 - 400 K/uL   Neutrophils Relative % 70  43 - 77 %   Neutro Abs 4.2  1.7 - 7.7 K/uL   Lymphocytes Relative 18  12 - 46 %   Lymphs Abs 1.1  0.7 - 4.0 K/uL   Monocytes Relative 12  3 - 12 %   Monocytes Absolute 0.7  0.1 - 1.0 K/uL   Eosinophils Relative 1  0 - 5 %   Eosinophils Absolute 0.1  0.0 - 0.7 K/uL   Basophils Relative 0  0 - 1 %   Basophils Absolute 0.0  0.0 - 0.1 K/uL  COMPREHENSIVE METABOLIC PANEL     Status: Abnormal   Collection Time    08/22/12  7:30 PM      Result Value Range   Sodium 141  135 - 145 mEq/L   Potassium 3.7  3.5 - 5.1 mEq/L   Chloride 107  96 - 112 mEq/L   CO2 28  19 - 32 mEq/L   Glucose, Bld 92  70 - 99 mg/dL   BUN 9  6 - 23 mg/dL   Creatinine, Ser 9.60  0.50 - 1.35 mg/dL   Calcium 8.7  8.4 - 45.4 mg/dL   Total Protein 6.0  6.0 - 8.3 g/dL   Albumin 3.3 (*) 3.5 - 5.2 g/dL   AST 33  0 - 37 U/L   ALT 24  0 - 53 U/L   Alkaline Phosphatase 57  39 - 117 U/L   Total Bilirubin 0.2 (*) 0.3 - 1.2 mg/dL   GFR calc non Af Amer 78 (*) >90 mL/min   GFR calc Af Amer >90  >90 mL/min   Comment:            The eGFR has been calculated     using the CKD EPI equation.     This calculation has not been     validated in all clinical     situations.     eGFR's persistently     <90 mL/min signify     possible Chronic Kidney Disease.  ETHANOL     Status: None   Collection Time    08/22/12  7:30 PM      Result Value Range   Alcohol, Ethyl (B) <11  0 - 11 mg/dL   Comment:            LOWEST DETECTABLE LIMIT FOR     SERUM ALCOHOL IS 11 mg/dL     FOR MEDICAL PURPOSES ONLY  SALICYLATE LEVEL     Status: Abnormal   Collection Time    08/22/12  7:30 PM      Result Value Range    Salicylate Lvl <2.0 (*) 2.8 - 20.0 mg/dL  ACETAMINOPHEN LEVEL     Status: None   Collection Time    08/22/12  7:30 PM      Result Value Range   Acetaminophen (Tylenol), Serum <15.0  10 - 30 ug/mL   Comment:            THERAPEUTIC CONCENTRATIONS VARY     SIGNIFICANTLY. A RANGE OF 10-30     ug/mL MAY BE AN EFFECTIVE     CONCENTRATION FOR MANY PATIENTS.     HOWEVER, SOME ARE BEST TREATED     AT CONCENTRATIONS OUTSIDE THIS     RANGE.     ACETAMINOPHEN CONCENTRATIONS     >150 ug/mL AT 4 HOURS AFTER     INGESTION AND >50 ug/mL AT 12     HOURS AFTER INGESTION ARE     OFTEN ASSOCIATED WITH TOXIC     REACTIONS.  TROPONIN I     Status: Abnormal   Collection Time    08/22/12  7:30 PM      Result Value Range   Troponin I 0.44 (*) <0.30 ng/mL   Comment:            Due to the release kinetics of cTnI,     a negative result within the first hours     of the onset of symptoms does not rule out     myocardial infarction with certainty.     If myocardial infarction is still suspected,     repeat the test at appropriate intervals.     CRITICAL RESULT CALLED TO, READ BACK BY AND VERIFIED WITH:     FHOBSON RN AT 2045 ON [id number] BY DLONG  POCT I-STAT TROPONIN I     Status: Abnormal   Collection Time    08/22/12  7:36  PM      Result Value Range   Troponin i, poc 0.16 (*) 0.00 - 0.08 ng/mL   Comment NOTIFIED PHYSICIAN     Comment 3            Comment: Due to the release kinetics of cTnI,     a negative result within the first hours     of the onset of symptoms does not rule out     myocardial infarction with certainty.     If myocardial infarction is still suspected,     repeat the test at appropriate intervals.  URINE RAPID DRUG SCREEN (HOSP PERFORMED)     Status: Abnormal   Collection Time    08/22/12  8:30 PM      Result Value Range   Opiates NONE DETECTED  NONE DETECTED   Cocaine POSITIVE (*) NONE DETECTED   Benzodiazepines NONE DETECTED  NONE DETECTED   Amphetamines NONE DETECTED   NONE DETECTED   Tetrahydrocannabinol POSITIVE (*) NONE DETECTED   Barbiturates NONE DETECTED  NONE DETECTED   Comment:            DRUG SCREEN FOR MEDICAL PURPOSES     ONLY.  IF CONFIRMATION IS NEEDED     FOR ANY PURPOSE, NOTIFY LAB     WITHIN 5 DAYS.                LOWEST DETECTABLE LIMITS     FOR URINE DRUG SCREEN     Drug Class       Cutoff (ng/mL)     Amphetamine      1000     Barbiturate      200     Benzodiazepine   200     Tricyclics       300     Opiates          300     Cocaine          300     THC              50  URINALYSIS, ROUTINE W REFLEX MICROSCOPIC     Status: Abnormal   Collection Time    08/22/12  8:46 PM      Result Value Range   Color, Urine YELLOW  YELLOW   APPearance CLOUDY (*) CLEAR   Specific Gravity, Urine 1.031 (*) 1.005 - 1.030   pH 6.5  5.0 - 8.0   Glucose, UA NEGATIVE  NEGATIVE mg/dL   Hgb urine dipstick NEGATIVE  NEGATIVE   Bilirubin Urine NEGATIVE  NEGATIVE   Ketones, ur NEGATIVE  NEGATIVE mg/dL   Protein, ur 30 (*) NEGATIVE mg/dL   Urobilinogen, UA 1.0  0.0 - 1.0 mg/dL   Nitrite NEGATIVE  NEGATIVE   Leukocytes, UA NEGATIVE  NEGATIVE  URINE MICROSCOPIC-ADD ON     Status: Abnormal   Collection Time    08/22/12  8:46 PM      Result Value Range   WBC, UA 0-2  <3 WBC/hpf   Bacteria, UA FEW (*) RARE   Casts HYALINE CASTS (*) NEGATIVE   Crystals CA OXALATE CRYSTALS (*) NEGATIVE   Urine-Other MUCOUS PRESENT    TROPONIN I     Status: None   Collection Time    08/23/12 12:05 AM      Result Value Range   Troponin I <0.30  <0.30 ng/mL   Comment:            Due to the release  kinetics of cTnI,     a negative result within the first hours     of the onset of symptoms does not rule out     myocardial infarction with certainty.     If myocardial infarction is still suspected,     repeat the test at appropriate intervals.  GLUCOSE, CAPILLARY     Status: None   Collection Time    08/23/12 12:07 AM      Result Value Range   Glucose-Capillary 92   70 - 99 mg/dL    Dg Chest 1 View  1/61/0960   *RADIOLOGY REPORT*  Clinical Data: Altered mental status.  Diabetes.  CHEST - 1 VIEW  Comparison: 09/30/2007.  Findings:  Cardiopericardial silhouette within normal limits. Mediastinal contours normal. Trachea midline.  No airspace disease or effusion. Monitoring leads are projected over the chest.  IMPRESSION: No active cardiopulmonary disease.   Original Report Authenticated By: Andreas Newport, M.D.   Ct Head Wo Contrast  08/22/2012   *RADIOLOGY REPORT*  Clinical Data: Altered mental status  CT HEAD WITHOUT CONTRAST  Technique:  Contiguous axial images were obtained from the base of the skull through the vertex without contrast. Study was obtained within 24 hours of patient arrival at the emergency department.  Comparison: December 10, 2007  Findings: Ventricles are normal in size and configuration.  There is no mass, hemorrhage, extra-axial fluid collection, or midline shift.  The gray-white compartments are normal.  No acute infarct apparent.  Bony calvarium appears intact.  The mastoid air cells are clear.  There is minimal mucosal thickening in both maxillary antra.  IMPRESSION: Minimal maxillary sinus disease bilaterally.  Study otherwise unremarkable.   Original Report Authenticated By: Bretta Bang, M.D.    Review of Systems  Unable to perform ROS: medical condition  Respiratory: Negative for shortness of breath.   Cardiovascular: Negative for chest pain and leg swelling.   Blood pressure 129/83, pulse 56, temperature 98.5 F (36.9 C), temperature source Oral, resp. rate 16, SpO2 99.00%. Physical Exam  Nursing note and vitals reviewed. Constitutional: He appears well-developed and well-nourished.  Very sleepy; arousable   HENT:  Nose: Nose normal.  Mouth/Throat: Oropharynx is clear and moist. No oropharyngeal exudate.  Eyes: Conjunctivae are normal. No scleral icterus.  Pupils 2 mm and slightly reactive  Neck: Normal range of  motion. Neck supple. No JVD present. No tracheal deviation present. No thyromegaly present.  Cardiovascular: Regular rhythm, normal heart sounds and intact distal pulses.  Exam reveals no gallop.   No murmur heard. Bradycardic 45-55  Respiratory: Effort normal and breath sounds normal. No respiratory distress. He has no wheezes.  GI: Soft. Bowel sounds are normal. He exhibits no distension. There is no tenderness.  Musculoskeletal: Normal range of motion. He exhibits no edema and no tenderness.  Neurological:  Arousable; knows name, hospital, from "everywhere"  Skin: Skin is warm and dry. No rash noted. He is not diaphoretic. No erythema.  Psychiatric:  Very drowsy but arousable    Labs reviewed; troponin 0.16, troponon 0.44, troponin < 0.3 ECG with 1st degree AV block, PR 204 msec, sinus arrhythmia, and borderline QT prolongation Urine tox + marijuana/cocaine, calcium oxlate crystals  Problem List Lethargy/intoxication/altered mental status  Cocaine +/Marijuana + Elevated Troponin Polysubstance abuse Bipolar disorder Schizophrenia  Assessment/Plan: 27 yo homeless man with PMH of bipolar disorder, polysubstance abuse found to be lethargic/altered mental status and brought in by EMS and found to have elevated troponin (slightly). Differential diagnosis is almost certainly  cocaine induced myocardial injury. This is likely low level injury given pattern of enzymes and use of cocaine. He could also have some underlying CAD if cocaine use has been prolonged and/or he has had long standing diabetes but I have no evidence of either fact. He is chest pain free and has normal ECG. At this time, his primary medical issues appear to be substance abuse and mental illness. His troponins have already normalized and he needs counseling and medical therapy to treat his substance abuse and mental illness. He should stop using cocaine, refrain from any tobacco, treat his mental illness and gradually start  exercise and nutrition program. We will watch his cardiac markers and telemetry. Drug and smoking cessation counseling should also be provided.    Stephen Hutchinson 08/23/2012, 12:47 AM

## 2012-08-23 NOTE — Progress Notes (Signed)
[facility] PROGRESS NOTE  Stephen Hutchinson AVW:[id number] DOB: 01-Mar-1985 DOA: 08/22/2012 PCP: Default, Provider, MD  Assessment/Plan: 1. Altered Mental status - Likely from polysubstance, tox screen positive for cocaine, cannabis, but could be other agents involved as well - mentation improved back to baseline -CT head negative  2. Elevated Troponin - likely transient elevation from cocaine induced vasospasm - repeat Enzymes negative - cards following, continue ASA,  -check ECHO  3. Polysubstance abuse  -counseled -Social work consulted  DVT proph: heparin SQ  Code Status: FULL Family Communication: None at bedside Disposition Plan: home soon, ? tomorrow   Consultants: CArdiology  HPI/Subjective: Doing ok, mentation improved, denies any chest pain, SOB  Objective: Filed Vitals:   08/23/12 0330 08/23/12 0722 08/23/12 0800 08/23/12 1129  BP: 135/84 142/87 134/81 130/69  Pulse: 83  70   Temp:  98.4 F (36.9 C)  98.5 F (36.9 C)  TempSrc:  Oral  Oral  Resp: 19 20 15 13   SpO2: 100% 100% 100% 100%    Intake/Output Summary (Last 24 hours) at 08/23/12 1324 Last data filed at 08/23/12 1100  Gross per 24 hour  Intake    485 ml  Output      0 ml  Net    485 ml   There were no vitals filed for this visit.  Exam:   General: AAOx3  Cardiovascular:S1S2/RRR  Respiratory: CTAB  Abdomen: soft, Nt, BS present  Musculoskeletal: no edema c/c  Neuro: non focal   Data Reviewed: Basic Metabolic Panel:  Recent Labs Lab 08/22/12 1930  NA 141  K 3.7  CL 107  CO2 28  GLUCOSE 92  BUN 9  CREATININE 1.24  CALCIUM 8.7   Liver Function Tests:  Recent Labs Lab 08/22/12 1930  AST 33  ALT 24  ALKPHOS 57  BILITOT 0.2*  PROT 6.0  ALBUMIN 3.3*   No results found for this basename: LIPASE, AMYLASE,  in the last 168 hours No results found for this basename: AMMONIA,  in the last 168 hours CBC:  Recent Labs Lab 08/22/12 1930  WBC 6.0  NEUTROABS  4.2  HGB 10.6*  HCT 33.4*  MCV 87.2  PLT 186   Cardiac Enzymes:  Recent Labs Lab 08/22/12 1930 08/23/12 0005  TROPONINI 0.44* <0.30   BNP (last 3 results) No results found for this basename: PROBNP,  in the last 8760 hours CBG:  Recent Labs Lab 08/23/12 0007 08/23/12 0732  GLUCAP 92 79    Recent Results (from the past 240 hour(s))  MRSA PCR SCREENING     Status: None   Collection Time    08/23/12  1:19 AM      Result Value Range Status   MRSA by PCR NEGATIVE  NEGATIVE Final   Comment:            The GeneXpert MRSA Assay (FDA     approved for NASAL specimens     only), is one component of a     comprehensive MRSA colonization     surveillance program. It is not     intended to diagnose MRSA     infection nor to guide or     monitor treatment for     MRSA infections.     Studies: Dg Chest 1 View  08/22/2012   *RADIOLOGY REPORT*  Clinical Data: Altered mental status.  Diabetes.  CHEST - 1 VIEW  Comparison: 09/30/2007.  Findings:  Cardiopericardial silhouette within normal limits. Mediastinal contours normal. Trachea midline.  No airspace disease or effusion. Monitoring leads are projected over the chest.  IMPRESSION: No active cardiopulmonary disease.   Original Report Authenticated By: Andreas Newport, M.D.   Ct Head Wo Contrast  08/22/2012   *RADIOLOGY REPORT*  Clinical Data: Altered mental status  CT HEAD WITHOUT CONTRAST  Technique:  Contiguous axial images were obtained from the base of the skull through the vertex without contrast. Study was obtained within 24 hours of patient arrival at the emergency department.  Comparison: December 10, 2007  Findings: Ventricles are normal in size and configuration.  There is no mass, hemorrhage, extra-axial fluid collection, or midline shift.  The gray-white compartments are normal.  No acute infarct apparent.  Bony calvarium appears intact.  The mastoid air cells are clear.  There is minimal mucosal thickening in both maxillary  antra.  IMPRESSION: Minimal maxillary sinus disease bilaterally.  Study otherwise unremarkable.   Original Report Authenticated By: Bretta Bang, M.D.    Scheduled Meds: . heparin  5,000 Units Subcutaneous Q8H  . sodium chloride  3 mL Intravenous Q12H   Continuous Infusions: . sodium chloride 75 mL/hr at 08/23/12 0800    Principal Problem:   Elevated troponin Active Problems:   Polysubstance abuse   Cocaine abuse   Altered mental status    Time spent:    [facility]  [facility] Pager [phone]. If 7PM-7AM, please contact night-coverage at [email or web address], password [facility] 08/23/2012, 1:24 PM  LOS: 1 day

## 2012-08-23 NOTE — Care Management Note (Signed)
    Page 1 of 1   08/23/2012     10:22:03 AM   CARE MANAGEMENT NOTE 08/23/2012  Patient:  Stephen Hutchinson, Stephen Hutchinson   Account Number:  [id number]  Date Initiated:  08/23/2012  Documentation initiated by:  Junius Creamer  Subjective/Objective Assessment:   adm w pos troponin     Action/Plan:   chart states homeless, sw ref has been made   Anticipated DC Date:     Anticipated DC Plan:    In-house referral  Clinical Social Worker      DC Associate Professor  CM consult      Choice offered to / List presented to:             Status of service:   Medicare Important Message given?   (If response is "NO", the following Medicare IM given date fields will be blank) Date Medicare IM given:   Date Additional Medicare IM given:    Discharge Disposition:    Per UR Regulation:  Reviewed for med. necessity/level of care/duration of stay  If discussed at Long Length of Stay Meetings, dates discussed:    Comments:

## 2012-08-23 NOTE — Progress Notes (Signed)
Pt transferred from [id number]. Pt on floor for short time and requested to go home.  I explained that he was not ready for discharge.  I called Dr. Jomarie Longs and explained that pt did not want to stay.  He wanted his IV out and go outside for a cigarette.  After offering choices, pt expressed need to sign out AMA. Paperwork signed and placed in chart.  This pt had no care plan or education from previous unit. Stephen Hutchinson

## 2012-08-23 NOTE — Progress Notes (Signed)
I was ask to see [name] s/p transfer from [facility]. Stephen Hutchinson is a 27 y.o. male who presents to the ED with AMS. He has a history of BPD and schizophrenia as well as polysubstance abuse. He is homeless, was on the side of the street when EMS picked him up. He told EMS that he had some headache but appeared to be very intoxicated. Due to intoxication he was not able to give much more history at that time. In the ED labs were remarkable for an elevated troponin on the i-stat, confirmed by lab draws. Remainder of his work up was remarkable for positive THC and cocaine in the urine, and otherwise unremarkable. Narcan x2 did not improve mental status. Hospitalist was  asked to admit. Cardiology was consulted and requested transfer to [facility]. At bedside pt is arousable but very lethargic. Will open eyes and is oriented to person, year and knows he is in the hospital (unsure which one). Current VSS. Cardiology has seen pt. 2nd troponin normal. Will continue to monitor closely in SDU.  Leanne Chang, NP-C [facility] Pager [phone]

## 2012-08-23 NOTE — Clinical Social Work Note (Signed)
Clinical Social Work Department BRIEF PSYCHOSOCIAL ASSESSMENT 08/23/2012  Patient:  Stephen Hutchinson, Stephen Hutchinson     Account Number:  [id number]     Admit date:  08/22/2012  Clinical Social Worker:  Hulan Fray  Date/Time:  08/23/2012 02:41 PM  Referred by:  Physician  Date Referred:  08/23/2012 Referred for  Substance Abuse   Other Referral:   Interview type:  Patient Other interview type:    PSYCHOSOCIAL DATA Living Status:  ALONE Admitted from facility:   Level of care:   Primary support name:  None given Primary support relationship to patient:  NONE Degree of support available:    CURRENT CONCERNS Current Concerns  Substance Abuse   Other Concerns:    SOCIAL WORK ASSESSMENT / PLAN Clinical Social Worker received referral for patient being homeless and substance abuse. CSW introduced self and explained reason for visit. Patient was agreeable to speak with CSW regarding his substance abuse and homelessness.    Patient reported that his drug of choice is marijuana and has been smoking for about 10-15 years. Patient reported that "it wouldn't be hard to quit." Patient also expressed interest in receiving resources for substance abuse treatment and list of shelters. Patient reported that he has no family that live in [place]. Patient reported that he does have children (1 boy and 2 girls) that stay with their mothers. Patient reports and "off and on, not so good" relationship with children. Patient reported that he has not sought treatment previously for substance abuse.    Patient reported that he has been homeless "all my life." Patient reports being familiar with [facility] and plans to return at discharge.    CSW provided patient with list of shelters, [facility] and substance abuse treatment lists.   Assessment/plan status:  Information/Referral to [facility] Other assessment/ plan:   Information/referral to community resources:   List of homeless shelters  [facility]   Substane abuse treatment facilities (outpatient and residential)    PATIENT'S/FAMILY'S RESPONSE TO PLAN OF CARE: Patient reported that he plans to go to [facility] at discharge. Patient was interested in receiving resources for substance abuse. Patient was appreciative of CSW"s visit and assistance.

## 2012-08-24 LAB — GLUCOSE, CAPILLARY: Glucose-Capillary: 77 mg/dL (ref 70–99)

## 2012-08-29 NOTE — Discharge Summary (Signed)
Physician Discharge Summary  Stephen Hutchinson ZOX:[id number] DOB: 1986-01-17 DOA: 08/22/2012  PCP: Default, Provider, MD  Admit date: 08/22/2012 Discharge date: 08/23/2012  LEFT Against Medical Advice  Discharge Diagnoses:  Principal Problem:   Elevated troponin Active Problems:   Polysubstance abuse   Cocaine abuse   Altered mental status   There were no vitals filed for this visit.  History of present illness:  Stephen Hutchinson is a 27 y.o. male who presents to the ED with AMS. He has a history of BPD and schizophrenia as well as polysubstance abuse. He is homeless, was on the side of the street when EMS picked him up. He told EMS that he had some headache but appeared to be very intoxicated. Due to intoxication he is not able to give much more history at this time.  In the ED labs were remarkable for an elevated troponin on the i-stat, confirmed by lab draws. Remainder of his work up was remarkable for positive THC and cocaine in the urine, and otherwise unremarkable. Narcan x2 did not improve mental status. Hospitalist has been asked to admit.  Hospital Course:   Pt left AMA, before workup as detailed below was completed   1. Altered Mental status - Likely from polysubstance, tox screen positive for cocaine, cannabis, but could be other agents involved as well  - mentation improved back to baseline  -CT head negative   2. Elevated Troponin  - likely transient elevation from cocaine induced vasospasm  - repeat Enzymes negative  - cards following, continue ASA,  -2D ECHO pending  3. Polysubstance/Cocaine abuse  -counseled  -Social work consult pending      Discharge Exam: Filed Vitals:   08/23/12 0722 08/23/12 0800 08/23/12 1129 08/23/12 1449  BP: 142/87 134/81 130/69 128/82  Pulse:  70  84  Temp: 98.4 F (36.9 C)  98.5 F (36.9 C) 98.7 F (37.1 C)  TempSrc: Oral  Oral Oral  Resp: 20 15 13 17   SpO2: 100% 100% 100% 100%     Discharge Instructions      Medication List    Notice   You have not been prescribed any medications.     Allergies  Allergen Reactions  . Other     Seafood--pt states he is unsure what happens, but that he is allergic.  Marland Kitchen Penicillins Cross Reactors Swelling      The results of significant diagnostics from this hospitalization (including imaging, microbiology, ancillary and laboratory) are listed below for reference.    Significant Diagnostic Studies: Dg Chest 1 View  08/22/2012   *RADIOLOGY REPORT*  Clinical Data: Altered mental status.  Diabetes.  CHEST - 1 VIEW  Comparison: 09/30/2007.  Findings:  Cardiopericardial silhouette within normal limits. Mediastinal contours normal. Trachea midline.  No airspace disease or effusion. Monitoring leads are projected over the chest.  IMPRESSION: No active cardiopulmonary disease.   Original Report Authenticated By: Andreas Newport, M.D.   Ct Head Wo Contrast  08/22/2012   *RADIOLOGY REPORT*  Clinical Data: Altered mental status  CT HEAD WITHOUT CONTRAST  Technique:  Contiguous axial images were obtained from the base of the skull through the vertex without contrast. Study was obtained within 24 hours of patient arrival at the emergency department.  Comparison: December 10, 2007  Findings: Ventricles are normal in size and configuration.  There is no mass, hemorrhage, extra-axial fluid collection, or midline shift.  The gray-white compartments are normal.  No acute infarct apparent.  Bony calvarium appears intact.  The mastoid air  cells are clear.  There is minimal mucosal thickening in both maxillary antra.  IMPRESSION: Minimal maxillary sinus disease bilaterally.  Study otherwise unremarkable.   Original Report Authenticated By: Bretta Bang, M.D.    Microbiology: Recent Results (from the past 240 hour(s))  MRSA PCR SCREENING     Status: None   Collection Time    08/23/12  1:19 AM      Result Value Range Status   MRSA by PCR NEGATIVE  NEGATIVE Final   Comment:             The GeneXpert MRSA Assay (FDA     approved for NASAL specimens     only), is one component of a     comprehensive MRSA colonization     surveillance program. It is not     intended to diagnose MRSA     infection nor to guide or     monitor treatment for     MRSA infections.     Labs: Basic Metabolic Panel: No results found for this basename: NA, K, CL, CO2, GLUCOSE, BUN, CREATININE, CALCIUM, MG, PHOS,  in the last 168 hours Liver Function Tests: No results found for this basename: AST, ALT, ALKPHOS, BILITOT, PROT, ALBUMIN,  in the last 168 hours No results found for this basename: LIPASE, AMYLASE,  in the last 168 hours No results found for this basename: AMMONIA,  in the last 168 hours CBC: No results found for this basename: WBC, NEUTROABS, HGB, HCT, MCV, PLT,  in the last 168 hours Cardiac Enzymes:  Recent Labs Lab 08/23/12 0005  TROPONINI <0.30   BNP: BNP (last 3 results) No results found for this basename: PROBNP,  in the last 8760 hours CBG:  Recent Labs Lab 08/23/12 0007 08/23/12 0641 08/23/12 0732 08/23/12 1324  GLUCAP 92 77 79 122*       Signed:  Ethyn Schetter  [facility] 08/29/2012, 7:41 PM

## 2013-06-14 ENCOUNTER — Encounter (HOSPITAL_COMMUNITY): Payer: Self-pay | Admitting: Emergency Medicine

## 2013-06-14 ENCOUNTER — Emergency Department (HOSPITAL_COMMUNITY)
Admission: EM | Admit: 2013-06-14 | Discharge: 2013-06-14 | Disposition: A | Discharge status: Home / Self Care | Payer: Self-pay | Attending: Emergency Medicine | Admitting: Emergency Medicine

## 2013-06-14 ENCOUNTER — Emergency Department (HOSPITAL_COMMUNITY): Payer: Self-pay

## 2013-06-14 DIAGNOSIS — E119 Type 2 diabetes mellitus without complications: Secondary | ICD-10-CM | POA: Insufficient documentation

## 2013-06-14 DIAGNOSIS — F17200 Nicotine dependence, unspecified, uncomplicated: Secondary | ICD-10-CM | POA: Insufficient documentation

## 2013-06-14 DIAGNOSIS — IMO0002 Reserved for concepts with insufficient information to code with codable children: Secondary | ICD-10-CM | POA: Insufficient documentation

## 2013-06-14 DIAGNOSIS — Z880 Allergy status to penicillin: Secondary | ICD-10-CM | POA: Insufficient documentation

## 2013-06-14 DIAGNOSIS — Y990 Civilian activity done for income or pay: Secondary | ICD-10-CM | POA: Insufficient documentation

## 2013-06-14 DIAGNOSIS — Y9389 Activity, other specified: Secondary | ICD-10-CM | POA: Insufficient documentation

## 2013-06-14 DIAGNOSIS — Z8659 Personal history of other mental and behavioral disorders: Secondary | ICD-10-CM | POA: Insufficient documentation

## 2013-06-14 DIAGNOSIS — T148XXA Other injury of unspecified body region, initial encounter: Secondary | ICD-10-CM

## 2013-06-14 DIAGNOSIS — Z23 Encounter for immunization: Secondary | ICD-10-CM | POA: Insufficient documentation

## 2013-06-14 DIAGNOSIS — Y9289 Other specified places as the place of occurrence of the external cause: Secondary | ICD-10-CM | POA: Insufficient documentation

## 2013-06-14 MED ORDER — LIDOCAINE-EPINEPHRINE-TETRACAINE (LET) SOLUTION
3.0000 mL | Freq: Once | NASAL | Status: AC
  Administered 2013-06-14: 3 mL via TOPICAL
  Filled 2013-06-14: qty 3

## 2013-06-14 MED ORDER — CEPHALEXIN 500 MG PO CAPS: ORAL_CAPSULE | ORAL | Status: DC

## 2013-06-14 MED ORDER — CEPHALEXIN 500 MG PO CAPS
1000.0000 mg | ORAL_CAPSULE | Freq: Once | ORAL | Status: AC
  Administered 2013-06-14: 1000 mg via ORAL
  Filled 2013-06-14: qty 2

## 2013-06-14 MED ORDER — TETANUS-DIPHTH-ACELL PERTUSSIS 5-2.5-18.5 LF-MCG/0.5 IM SUSP
0.5000 mL | Freq: Once | INTRAMUSCULAR | Status: AC
  Administered 2013-06-14: 0.5 mL via INTRAMUSCULAR
  Filled 2013-06-14: qty 0.5

## 2013-06-14 NOTE — ED Notes (Signed)
Pt presents with c/o abrasion to his left hand. Pt says he got the abrasion when he was working. Pt says that he hit his hand on a sharp metal piece.

## 2013-06-14 NOTE — ED Notes (Signed)
PA at bedside.

## 2013-06-14 NOTE — ED Notes (Signed)
Pt escorted to discharge window. Verbalized understanding discharge instructions. In no acute distress.  Pt given a bus pass.  

## 2013-06-14 NOTE — ED Notes (Addendum)
Pt c/o L hand pain.  Abrasion noted on posterior ring finger and anterior palm.  Pt could not give a straight answer about how the injury occurred.  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  This RN noted a sandwich, 6-8 unopened packages of graham crackers, 2-3 unopened containers of peanut butter, and 2 drinks.  Pt informed that he should eat what has been provided, due to not being here long enough to get a breakfast tray.

## 2013-06-14 NOTE — ED Provider Notes (Signed)
CSN: [id number]     Arrival date & time 06/14/13  [id number] History   First MD Initiated Contact with Patient 06/14/13 [phone]     Chief Complaint  Patient presents with  . Extremity Laceration     (Consider location/radiation/quality/duration/timing/severity/associated sxs/prior Treatment) HPI Comments: Patient is a 28 year old right hand dominant male with history of diabetes, bipolar 1 disorder, schizophrenia who presents today with abrasions to his left hand. He states this occurred while he was at work around 11am yesterday. A piece of metal came toward him and he hit it away. Unsure of his last tetanus shot. He is able to describe the pain, but states "all of them" when given descriptors sharp, dull, burning, throbbing. He is able to move his hand, but had increased pain.   The history is provided by the patient. No language interpreter was used.    Past Medical History  Diagnosis Date  . Diabetes mellitus   . Bipolar 1 disorder   . Schizophrenic disorder    History reviewed. No pertinent past surgical history. No family history on file. History  Substance Use Topics  . Smoking status: Current Every Day Smoker -- 1.00 packs/day  . Smokeless tobacco: Not on file  . Alcohol Use: Yes    Review of Systems  Constitutional: Negative for fever and chills.  Gastrointestinal: Negative for nausea, vomiting and abdominal pain.  Skin: Positive for wound.  Neurological: Negative for weakness and numbness.  All other systems reviewed and are negative.     Allergies  Other and Penicillins cross reactors  Home Medications   Prior to Admission medications   Not on File   BP 133/83  Pulse 96  Temp(Src) 98.6 F (37 C) (Oral)  Resp 20  Ht 6\' 2"  (1.88 m)  Wt 140 lb (63.504 kg)  BMI 17.97 kg/m2  SpO2 100% Physical Exam  Nursing note and vitals reviewed. Constitutional: He is oriented to person, place, and time. He appears well-developed and well-nourished. No distress.  HENT:   Head: Normocephalic and atraumatic.  Right Ear: External ear normal.  Left Ear: External ear normal.  Nose: Nose normal.  Eyes: Conjunctivae are normal.  Neck: Normal range of motion. No tracheal deviation present.  Cardiovascular: Normal rate, regular rhythm and normal heart sounds.   Pulmonary/Chest: Effort normal and breath sounds normal. No stridor.  Abdominal: Soft. He exhibits no distension. There is no tenderness.  Musculoskeletal: Normal range of motion.  Neurological: He is alert and oriented to person, place, and time.  Skin: Skin is warm and dry. He is not diaphoretic.  Superficial abrasion to left thenar eminence. Superficial abrasion to dorsal aspect of left third PIP.  Flexion and extension intact, patient with significant pain with these movements. Neurovascularly intact. Compartment soft.   Psychiatric: He has a normal mood and affect. His behavior is normal.    ED Course  Irrigation Performed by: Mora BellmanMERRELL, Maycol Hoying S Authorized by: Mora BellmanMERRELL, Jamelle Goldston S Consent: Verbal consent obtained. written consent not obtained. The procedure was performed in an emergent situation. Risks and benefits: risks, benefits and alternatives were discussed Consent given by: patient Patient understanding: patient states understanding of the procedure being performed Patient consent: the patient's understanding of the procedure matches consent given Required items: required blood products, implants, devices, and special equipment available Patient identity confirmed: verbally with patient and arm band Time out: Immediately prior to procedure a "time out" was called to verify the correct patient, procedure, equipment, support staff and site/side marked as  required. Preparation: Patient was prepped and draped in the usual sterile fashion. Local anesthesia used: yes Anesthesia: digital block Local anesthetic: lidocaine 1% without epinephrine Anesthetic total: 3 ml Patient sedated: no Patient  tolerance: Patient tolerated the procedure well with no immediate complications.   (including critical care time) Labs Review Labs Reviewed - No data to display  Imaging Review Dg Hand Complete Left  06/14/2013   CLINICAL DATA:  Extremity laceration.  EXAM: LEFT HAND - COMPLETE 3+ VIEW  COMPARISON:  None.  FINDINGS: There is no evidence of fracture or dislocation. There is no evidence of arthropathy or other focal bone abnormality. Mild soft tissue irregularity the palmar surface of the distal first digit without radiopaque foreign bodies or subcutaneous gas. .  IMPRESSION: Possible distal first digit laceration without radiopaque foreign bodies, no fracture deformity or dislocation.   Electronically Signed   By: Awilda Metroourtnay  Bloomer   On: 06/14/2013 06:42     EKG Interpretation None      MDM   Final diagnoses:  Abrasion   Patient presents to ED for evaluation of abrasions over left hand. This occurred around 11am yesterday. Patient gives a different story to every person that comes in the room. Patient tells me he hit a piece of metal away at work. Flexion and extension remain intact. No apparent tendon involvement. Sensation intact. Tdap updated. Patient was numbed via LET and digital block. Abrasions were copiously irrigated. Patient will be discharged with keflex. Dr. Adriana Simasook evaluated patient and agrees with plan. Return instructions given. Vital signs stable for discharge. Patient / Family / Caregiver informed of clinical course, understand medical decision-making process, and agree with plan.    Mora BellmanHannah S Rhyland Hinderliter, PA-C 06/14/13 [phone]

## 2013-06-14 NOTE — Discharge Instructions (Signed)
Abrasion °An abrasion is a cut or scrape of the skin. Abrasions do not extend through all layers of the skin and most heal within 10 days. It is important to care for your abrasion properly to prevent infection. °CAUSES  °Most abrasions are caused by falling on, or gliding across, the ground or other surface. When your skin rubs on something, the outer and inner layer of skin rubs off, causing an abrasion. °DIAGNOSIS  °Your caregiver will be able to diagnose an abrasion during a physical exam.  °TREATMENT  °Your treatment depends on how large and deep the abrasion is. Generally, your abrasion will be cleaned with water and a mild soap to remove any dirt or debris. An antibiotic ointment may be put over the abrasion to prevent an infection. A bandage (dressing) may be wrapped around the abrasion to keep it from getting dirty.  °You may need a tetanus shot if: °· You cannot remember when you had your last tetanus shot. °· You have never had a tetanus shot. °· The injury broke your skin. °If you get a tetanus shot, your arm may swell, get red, and feel warm to the touch. This is common and not a problem. If you need a tetanus shot and you choose not to have one, there is a rare chance of getting tetanus. Sickness from tetanus can be serious.  °HOME CARE INSTRUCTIONS  °· If a dressing was applied, change it at least once a day or as directed by your caregiver. If the bandage sticks, soak it off with warm water.   °· Wash the area with water and a mild soap to remove all the ointment 2 times a day. Rinse off the soap and pat the area dry with a clean towel.   °· Reapply any ointment as directed by your caregiver. This will help prevent infection and keep the bandage from sticking. Use gauze over the wound and under the dressing to help keep the bandage from sticking.   °· Change your dressing right away if it becomes wet or dirty.   °· Only take over-the-counter or prescription medicines for pain, discomfort, or fever as  directed by your caregiver.   °· Follow up with your caregiver within 24 48 hours for a wound check, or as directed. If you were not given a wound-check appointment, look closely at your abrasion for redness, swelling, or pus. These are signs of infection. °SEEK IMMEDIATE MEDICAL CARE IF:  °· You have increasing pain in the wound.   °· You have redness, swelling, or tenderness around the wound.   °· You have pus coming from the wound.   °· You have a fever or persistent symptoms for more than 2 3 days. °· You have a fever and your symptoms suddenly get worse. °· You have a bad smell coming from the wound or dressing.   °MAKE SURE YOU:  °· Understand these instructions. °· Will watch your condition. °· Will get help right away if you are not doing well or get worse. °Document Released: 10/29/2004 Document Revised: 01/06/2012 Document Reviewed: 12/23/2010 °Exit[employer]® Patient Information ©2014 [employer]. ° °

## 2013-06-15 NOTE — ED Provider Notes (Signed)
Medical screening examination/treatment/procedure(s) were conducted as a shared visit with non-physician practitioner(s) and myself.  I personally evaluated the patient during the encounter.   EKG Interpretation None     Medical screening examination/treatment/procedure(s) were conducted as a shared visit with non-physician practitioner(s) and myself.  I personally evaluated the patient during the encounter.   EKG Interpretation None     Status post laceration to dorsal aspect of the left third PIP.  Full range of motion of the joint. No obvious tendon involvement. Tetanus update. Laceration repair. Patient will get prompt followup.  Donnetta HutchingBrian Shireen Rayburn, MD 06/15/13 [phone]

## 2013-10-21 ENCOUNTER — Encounter (HOSPITAL_COMMUNITY): Payer: Self-pay | Admitting: Emergency Medicine

## 2013-10-21 ENCOUNTER — Emergency Department (HOSPITAL_COMMUNITY): Payer: Self-pay

## 2013-10-21 ENCOUNTER — Emergency Department (HOSPITAL_COMMUNITY)
Admission: EM | Admit: 2013-10-21 | Discharge: 2013-10-21 | Disposition: A | Discharge status: Home / Self Care | Payer: Self-pay | Attending: Emergency Medicine | Admitting: Emergency Medicine

## 2013-10-21 DIAGNOSIS — W010XXA Fall on same level from slipping, tripping and stumbling without subsequent striking against object, initial encounter: Secondary | ICD-10-CM

## 2013-10-21 DIAGNOSIS — F17200 Nicotine dependence, unspecified, uncomplicated: Secondary | ICD-10-CM | POA: Insufficient documentation

## 2013-10-21 DIAGNOSIS — E119 Type 2 diabetes mellitus without complications: Secondary | ICD-10-CM | POA: Insufficient documentation

## 2013-10-21 DIAGNOSIS — Z880 Allergy status to penicillin: Secondary | ICD-10-CM | POA: Insufficient documentation

## 2013-10-21 DIAGNOSIS — Z5900 Homelessness unspecified: Secondary | ICD-10-CM

## 2013-10-21 DIAGNOSIS — S80211A Abrasion, right knee, initial encounter: Secondary | ICD-10-CM

## 2013-10-21 DIAGNOSIS — S62001A Unspecified fracture of navicular [scaphoid] bone of right wrist, initial encounter for closed fracture: Secondary | ICD-10-CM

## 2013-10-21 DIAGNOSIS — Y9289 Other specified places as the place of occurrence of the external cause: Secondary | ICD-10-CM | POA: Insufficient documentation

## 2013-10-21 DIAGNOSIS — S80212A Abrasion, left knee, initial encounter: Secondary | ICD-10-CM

## 2013-10-21 DIAGNOSIS — Z8659 Personal history of other mental and behavioral disorders: Secondary | ICD-10-CM | POA: Insufficient documentation

## 2013-10-21 DIAGNOSIS — S46909A Unspecified injury of unspecified muscle, fascia and tendon at shoulder and upper arm level, unspecified arm, initial encounter: Secondary | ICD-10-CM | POA: Insufficient documentation

## 2013-10-21 DIAGNOSIS — IMO0002 Reserved for concepts with insufficient information to code with codable children: Secondary | ICD-10-CM | POA: Insufficient documentation

## 2013-10-21 DIAGNOSIS — Y9389 Activity, other specified: Secondary | ICD-10-CM | POA: Insufficient documentation

## 2013-10-21 DIAGNOSIS — S62009A Unspecified fracture of navicular [scaphoid] bone of unspecified wrist, initial encounter for closed fracture: Secondary | ICD-10-CM | POA: Insufficient documentation

## 2013-10-21 DIAGNOSIS — S4980XA Other specified injuries of shoulder and upper arm, unspecified arm, initial encounter: Secondary | ICD-10-CM | POA: Insufficient documentation

## 2013-10-21 HISTORY — DX: Homelessness unspecified: Z59.00

## 2013-10-21 HISTORY — DX: Homelessness: Z59.0

## 2013-10-21 LAB — CBG MONITORING, ED: Glucose-Capillary: 176 mg/dL — ABNORMAL HIGH (ref 70–99)

## 2013-10-21 MED ORDER — MELOXICAM 7.5 MG PO TABS: 7.5000 mg | ORAL_TABLET | Freq: Every day | ORAL | Status: AC

## 2013-10-21 MED ORDER — ONDANSETRON 4 MG PO TBDP
4.0000 mg | ORAL_TABLET | Freq: Once | ORAL | Status: AC
  Administered 2013-10-21: 4 mg via ORAL
  Filled 2013-10-21: qty 1

## 2013-10-21 MED ORDER — TRAMADOL HCL 50 MG PO TABS: 50.0000 mg | ORAL_TABLET | Freq: Four times a day (QID) | ORAL | Status: AC | PRN

## 2013-10-21 MED ORDER — OXYCODONE-ACETAMINOPHEN 5-325 MG PO TABS
2.0000 | ORAL_TABLET | Freq: Once | ORAL | Status: AC
Start: 2013-10-21 — End: 2013-10-21
  Administered 2013-10-21: 2 via ORAL
  Filled 2013-10-21: qty 2

## 2013-10-21 NOTE — Progress Notes (Signed)
Orthopedic Tech Progress Note Patient Details:  Stephen Hutchinson 16-Nov-1985 [id number] Combination volar and thumb spica splint applied to RUE to protect both patient fractures. Arm sling provided.  Ortho Devices Type of Ortho Device: Ace wrap;Arm sling;Thumb spica splint;Volar splint Splint Material: Plaster Ortho Device/Splint Location: RUE Ortho Device/Splint Interventions: Application   [name] 10/21/2013, 10:40 PM

## 2013-10-21 NOTE — ED Notes (Signed)
Pt drank diet ginger ale, sandwich, applesauce without nausea/vomiting.

## 2013-10-21 NOTE — ED Notes (Signed)
Pt given bag lunch (for dinner).

## 2013-10-21 NOTE — ED Notes (Signed)
Pt. arrived with EMS from street ( homeless) , tripped and fell yesterday reports pain at entire right arm , no LOC / ambulatory , alert and oriented/respirations unlabored .

## 2013-10-21 NOTE — ED Provider Notes (Signed)
CSN: [id number]     Arrival date & time 10/21/13  1854 History   First MD Initiated Contact with Patient 10/21/13 2000     Chief Complaint  Patient presents with  . Arm Injury     (Consider location/radiation/quality/duration/timing/severity/associated sxs/prior Treatment) HPI Pt is a 28yo male with hx of DM, bipolar 1 disorder, schizophrenic disorder, and homelessness, presenting to ED with c/o right wrist pain, and bilateral knee pain.  Pt states earlier this morning pt tripped on level ground and fell onto some jagged rocks. States he fell because he became lightheaded, thinks his blood sugar was too low. Denies hitting his head or LOC. Reports swelling and constant pain with decreased ROM of right wrist. Pain is aching and sore, 10/10, worse with movement. Also c/o bilateral aching knee pain, 8/10.  States he is homeless and does not take any medications for his diabetes. Is unsure what his sugar normally runs. Denies recent illness including fever, chills, n/v/d. Denies passing out.   Past Medical History  Diagnosis Date  . Diabetes mellitus   . Bipolar 1 disorder   . Schizophrenic disorder   . Homelessness    History reviewed. No pertinent past surgical history. No family history on file. History  Substance Use Topics  . Smoking status: Current Every Day Smoker -- 1.00 packs/day  . Smokeless tobacco: Not on file  . Alcohol Use: Yes    Review of Systems  Constitutional: Negative for fever and chills.  Respiratory: Negative for cough and shortness of breath.   Cardiovascular: Negative for chest pain and palpitations.  Gastrointestinal: Negative for nausea, vomiting, abdominal pain and diarrhea.  Endocrine: Negative for polyphagia and polyuria.  Musculoskeletal: Positive for arthralgias, joint swelling and myalgias.       Right wrist, bilateral knees  Skin: Positive for wound ( abrasion to bilateral knees). Negative for color change.  Neurological: Positive for  light-headedness. Negative for dizziness, seizures, syncope, weakness, numbness and headaches.  All other systems reviewed and are negative.     Allergies  Other and Penicillins cross reactors  Home Medications   Prior to Admission medications   Medication Sig Start Date End Date Taking? Authorizing Provider  meloxicam (MOBIC) 7.5 MG tablet Take 1 tablet (7.5 mg total) by mouth daily. 10/21/13   Junius Finner, PA-C  traMADol (ULTRAM) 50 MG tablet Take 1 tablet (50 mg total) by mouth every 6 (six) hours as needed. 10/21/13   Junius Finner, PA-C   BP 130/90  Pulse 69  Temp(Src) 97.4 F (36.3 C) (Oral)  Resp 18  SpO2 100% Physical Exam  Nursing note and vitals reviewed. Constitutional: He is oriented to person, place, and time. He appears well-developed and well-nourished.  HENT:  Head: Normocephalic and atraumatic.  Eyes: EOM are normal.  Neck: Normal range of motion.  Cardiovascular: Normal rate.   Pulses:      Radial pulses are 2+ on the right side.       Dorsalis pedis pulses are 2+ on the right side, and 2+ on the left side.       Posterior tibial pulses are 2+ on the right side, and 2+ on the left side.  Cap refill right hand <3 seconds.  Pulmonary/Chest: Effort normal.  Musculoskeletal: He exhibits edema and tenderness.  Right wrist: moderate edema to dorsal aspect with tenderness to palpation. Decreased flexion and extension of wrist due to pain. Decreased movement of fingers in right hand due to pain.  4/5 strength in right hand vs  left.   Bilateral knees: FROM, mild tenderness, no obvious deformities. Abrasion to both knees. Dried blood present, no active bleeding.  Neurological: He is alert and oriented to person, place, and time.  Skin: Skin is warm and dry.  Psychiatric: He has a normal mood and affect. His behavior is normal.    ED Course  Procedures (including critical care time) Labs Review Labs Reviewed  CBG MONITORING, ED - Abnormal; Notable for the  following:    Glucose-Capillary 176 (*)    All other components within normal limits    Imaging Review Dg Wrist Complete Right  10/21/2013   CLINICAL DATA:  Status post fall; right wrist swelling, with laceration at the mid metacarpals.  EXAM: RIGHT WRIST - COMPLETE 3+ VIEW  COMPARISON:  Right hand radiographs performed 09/10/2007  FINDINGS: There is a minimally displaced fracture through the scaphoid waist. There is also question of a tiny avulsion injury at the radial aspect of the base of the third metacarpal.  No additional fractures are seen. The carpal rows are otherwise grossly unremarkable. Mild negative ulnar variance is noted. The known soft tissue laceration is not well characterized on radiograph. Soft tissue swelling is noted about the wrist.  IMPRESSION: 1. Minimally displaced fracture through the scaphoid waist. 2. Question of a tiny avulsion injury at the radial aspect of the base of the third metacarpal.   Electronically Signed   By: Roanna Raider M.D.   On: 10/21/2013 21:25   Dg Hand Complete Right  10/21/2013   CLINICAL DATA:  Status post fall; diffuse right wrist pain. Laceration at the metacarpals.  EXAM: RIGHT HAND - COMPLETE 3+ VIEW  COMPARISON:  None.  FINDINGS: There is a minimally displaced fracture through the scaphoid waist. No additional fractures are seen.  The known soft tissue laceration is not well characterized on radiograph. Visualized joint spaces are preserved. The carpal rows are otherwise grossly intact. No radiopaque foreign bodies are seen.  IMPRESSION: Minimally displaced fracture through the scaphoid waist.   Electronically Signed   By: Roanna Raider M.D.   On: 10/21/2013 21:23     EKG Interpretation None      MDM   Final diagnoses:  Scaphoid fracture of wrist, right, closed, initial encounter  Abrasion, knee, right, initial encounter  Abrasion, knee, left, initial encounter  Fall from slip, trip, or stumble, initial encounter  Homelessness   Pt  is a 28yo male with hx of diabetes c/o right hand and wrist pain, and bilateral knee pain after fall earlier this morning.  Right hand is neurovascularly in tact.  Plain films: significant for minimally displaced fracture through the scaphoid waist.  Question of tiny avulsion injury at radial aspect of base of 3rd metacarpal.  Discussed pt with Dr. Loretha Stapler, will consult hand surgery.   Consulted with Dr. Orlan Leavens, hand surgery, will place pt in thumb spica splint and provided pt info to schedule f/u visit within 1-2 weeks.    Pt requested something to eat while in ED. CBG was 176 while in ED. Denies hitting head or LOC. No focal neuro deficit while in ED. Denies chest pain or SOB.  Do not believe further workup needed at this time.   Pt had 1 episode of emesis while in ED after drinking milk.  Pt given zofran and fluid challenge. Able to keep down several ounces of PO fluids. Pt states he does not feel nauseated, pt states he feels extremely tired as he has not been able to sleep  much recently.   Discussed pt with Dr. Loretha Stapler, no evidence of emergent process taking place at this time, pt may be discharged home. Return precautions provided. Pt verbalized understanding and agreement with tx plan.     Junius Finner, PA-C 10/22/13 1909

## 2013-10-21 NOTE — Discharge Instructions (Signed)
Cast or Splint Care °Casts and splints support injured limbs and keep bones from moving while they heal.  °HOME CARE °· Keep the cast or splint uncovered during the drying period. °¨ A plaster cast can take 24 to 48 hours to dry. °¨ A fiberglass cast will dry in less than 1 hour. °· Do not rest the cast on anything harder than a pillow for 24 hours. °· Do not put weight on your injured limb. Do not put pressure on the cast. Wait for your doctor's approval. °· Keep the cast or splint dry. °¨ Cover the cast or splint with a plastic bag during baths or wet weather. °¨ If you have a cast over your chest and belly (trunk), take sponge baths until the cast is taken off. °¨ If your cast gets wet, dry it with a towel or blow dryer. Use the cool setting on the blow dryer. °· Keep your cast or splint clean. Wash a dirty cast with a damp cloth. °· Do not put any objects under your cast or splint. °· Do not scratch the skin under the cast with an object. If itching is a problem, use a blow dryer on a cool setting over the itchy area. °· Do not trim or cut your cast. °· Do not take out the padding from inside your cast. °· Exercise your joints near the cast as told by your doctor. °· Raise (elevate) your injured limb on 1 or 2 pillows for the first 1 to 3 days. °GET HELP IF: °· Your cast or splint cracks. °· Your cast or splint is too tight or too loose. °· You itch badly under the cast. °· Your cast gets wet or has a soft spot. °· You have a bad smell coming from the cast. °· You get an object stuck under the cast. °· Your skin around the cast becomes red or sore. °· You have new or more pain after the cast is put on. °GET HELP RIGHT AWAY IF: °· You have fluid leaking through the cast. °· You cannot move your fingers or toes. °· Your fingers or toes turn blue or white or are cool, painful, or puffy (swollen). °· You have tingling or lose feeling (numbness) around the injured area. °· You have bad pain or pressure under the  cast. °· You have trouble breathing or have shortness of breath. °· You have chest pain. °Document Released: 05/21/2010 Document Revised: 09/21/2012 Document Reviewed: 07/28/2012 °[employer]® Patient Information ©2015 [employer]. This information is not intended to replace advice given to you by your health care provider. Make sure you discuss any questions you have with your health care provider. ° °

## 2013-10-22 NOTE — ED Provider Notes (Signed)
Medical screening examination/treatment/procedure(s) were performed by non-physician practitioner and as supervising physician I was immediately available for consultation/collaboration.   EKG Interpretation None        Candyce Churn III, MD 10/22/13 2340

## 2014-01-22 ENCOUNTER — Emergency Department (HOSPITAL_COMMUNITY)
Admission: EM | Admit: 2014-01-22 | Discharge: 2014-01-22 | Disposition: A | Discharge status: Home / Self Care | Payer: Self-pay | Attending: Emergency Medicine | Admitting: Emergency Medicine

## 2014-01-22 ENCOUNTER — Encounter (HOSPITAL_COMMUNITY): Payer: Self-pay | Admitting: Emergency Medicine

## 2014-01-22 DIAGNOSIS — L259 Unspecified contact dermatitis, unspecified cause: Secondary | ICD-10-CM | POA: Insufficient documentation

## 2014-01-22 DIAGNOSIS — L03115 Cellulitis of right lower limb: Secondary | ICD-10-CM | POA: Insufficient documentation

## 2014-01-22 DIAGNOSIS — F419 Anxiety disorder, unspecified: Secondary | ICD-10-CM | POA: Insufficient documentation

## 2014-01-22 DIAGNOSIS — Z720 Tobacco use: Secondary | ICD-10-CM | POA: Insufficient documentation

## 2014-01-22 DIAGNOSIS — Z880 Allergy status to penicillin: Secondary | ICD-10-CM | POA: Insufficient documentation

## 2014-01-22 DIAGNOSIS — L03116 Cellulitis of left lower limb: Secondary | ICD-10-CM | POA: Insufficient documentation

## 2014-01-22 DIAGNOSIS — Z590 Homelessness: Secondary | ICD-10-CM | POA: Insufficient documentation

## 2014-01-22 DIAGNOSIS — E119 Type 2 diabetes mellitus without complications: Secondary | ICD-10-CM | POA: Insufficient documentation

## 2014-01-22 DIAGNOSIS — Z791 Long term (current) use of non-steroidal anti-inflammatories (NSAID): Secondary | ICD-10-CM | POA: Insufficient documentation

## 2014-01-22 LAB — CBC WITH DIFFERENTIAL/PLATELET
Basophils Absolute: 0 10*3/uL (ref 0.0–0.1)
Basophils Relative: 0 % (ref 0–1)
Eosinophils Absolute: 0.3 10*3/uL (ref 0.0–0.7)
Eosinophils Relative: 6 % — ABNORMAL HIGH (ref 0–5)
HCT: 45.3 % (ref 39.0–52.0)
Hemoglobin: 13.8 g/dL (ref 13.0–17.0)
LYMPHS ABS: 1.5 10*3/uL (ref 0.7–4.0)
LYMPHS PCT: 34 % (ref 12–46)
MCH: 27.1 pg (ref 26.0–34.0)
MCHC: 30.5 g/dL (ref 30.0–36.0)
MCV: 88.8 fL (ref 78.0–100.0)
Monocytes Absolute: 0.5 10*3/uL (ref 0.1–1.0)
Monocytes Relative: 10 % (ref 3–12)
NEUTROS ABS: 2.2 10*3/uL (ref 1.7–7.7)
Neutrophils Relative %: 50 % (ref 43–77)
PLATELETS: 233 10*3/uL (ref 150–400)
RBC: 5.1 MIL/uL (ref 4.22–5.81)
RDW: 13.8 % (ref 11.5–15.5)
WBC: 4.5 10*3/uL (ref 4.0–10.5)

## 2014-01-22 LAB — COMPREHENSIVE METABOLIC PANEL
ALT: 26 U/L (ref 0–53)
ANION GAP: 15 (ref 5–15)
AST: 42 U/L — ABNORMAL HIGH (ref 0–37)
Albumin: 3.7 g/dL (ref 3.5–5.2)
Alkaline Phosphatase: 71 U/L (ref 39–117)
BUN: 9 mg/dL (ref 6–23)
CALCIUM: 9.2 mg/dL (ref 8.4–10.5)
CO2: 23 meq/L (ref 19–32)
CREATININE: 0.92 mg/dL (ref 0.50–1.35)
Chloride: 103 mEq/L (ref 96–112)
Glucose, Bld: 139 mg/dL — ABNORMAL HIGH (ref 70–99)
Potassium: 4.4 mEq/L (ref 3.7–5.3)
Sodium: 141 mEq/L (ref 137–147)
Total Bilirubin: <0.2 mg/dL — ABNORMAL LOW (ref 0.3–1.2)
Total Protein: 7.3 g/dL (ref 6.0–8.3)

## 2014-01-22 LAB — ETHANOL: Alcohol, Ethyl (B): <11 mg/dL (ref 0–11)

## 2014-01-22 MED ORDER — DIPHENHYDRAMINE HCL 50 MG/ML IJ SOLN
25.0000 mg | Freq: Once | INTRAMUSCULAR | Status: AC
  Administered 2014-01-22: 25 mg via INTRAVENOUS
  Filled 2014-01-22: qty 1

## 2014-01-22 MED ORDER — CETIRIZINE HCL 10 MG PO CAPS: 10.0000 mg | ORAL_CAPSULE | Freq: Every day | ORAL | Status: AC | PRN

## 2014-01-22 MED ORDER — SULFAMETHOXAZOLE-TRIMETHOPRIM 800-160 MG PO TABS: 1.0000 | ORAL_TABLET | Freq: Two times a day (BID) | ORAL | Status: AC

## 2014-01-22 MED ORDER — METHYLPREDNISOLONE SODIUM SUCC 125 MG IJ SOLR
125.0000 mg | Freq: Once | INTRAMUSCULAR | Status: AC
  Administered 2014-01-22: 125 mg via INTRAVENOUS
  Filled 2014-01-22: qty 2

## 2014-01-22 MED ORDER — FAMOTIDINE IN NACL 20-0.9 MG/50ML-% IV SOLN
20.0000 mg | Freq: Once | INTRAVENOUS | Status: AC
  Administered 2014-01-22: 20 mg via INTRAVENOUS
  Filled 2014-01-22: qty 50

## 2014-01-22 MED ORDER — SODIUM CHLORIDE 0.9 % IV SOLN
INTRAVENOUS | Status: DC
  Administered 2014-01-22: 125 mL/h via INTRAVENOUS

## 2014-01-22 MED ORDER — PREDNISONE 20 MG PO TABS: ORAL_TABLET | ORAL | Status: AC

## 2014-01-22 MED ORDER — DIPHENHYDRAMINE HCL 25 MG PO TABS: ORAL_TABLET | ORAL | Status: AC

## 2014-01-22 MED ORDER — FAMOTIDINE 20 MG PO TABS: 20.0000 mg | ORAL_TABLET | Freq: Two times a day (BID) | ORAL | Status: AC

## 2014-01-22 NOTE — ED Notes (Signed)
Pt is unable to urinate at this time. 

## 2014-01-22 NOTE — ED Provider Notes (Addendum)
CSN: [id number]     Arrival date & time 01/22/14  0431 History   First MD Initiated Contact with Patient 01/22/14 0446     Chief Complaint  Patient presents with  . Rash  . Wrist Pain     (Consider location/radiation/quality/duration/timing/severity/associated sxs/prior Treatment) HPI  Patient reports he has been having a rash for a few weeks. He states he's itching all over. As I was doing my interview he is vigorously scratching his groin area under his blanket. He denies any swelling in his tongue or throat. He denies difficulty swallowing or breathing. He denies any fever. 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 He denies any new exposures. He states he's never had this before.  PCP none  Past Medical History  Diagnosis Date  . Diabetes mellitus   . Bipolar 1 disorder   . Schizophrenic disorder   . Homelessness    History reviewed. No pertinent past surgical history. History reviewed. No pertinent family history. History  Substance Use Topics  . Smoking status: Current Every Day Smoker -- 1.00 packs/day  . Smokeless tobacco: Not on file  . Alcohol Use: Yes     Comment: occ  homeless  Review of Systems  All other systems reviewed and are negative.     Allergies  Other and Penicillins cross reactors  Home Medications   Prior to Admission medications   Medication Sig Start Date End Date Taking? Authorizing Provider  meloxicam (MOBIC) 7.5 MG tablet Take 1 tablet (7.5 mg total) by mouth daily. Patient not taking: Reported on 01/22/2014 10/21/13   Junius FinnerErin O'Malley, PA-C  traMADol (ULTRAM) 50 MG tablet Take 1 tablet (50 mg total) by mouth every 6 (six) hours as needed. Patient not taking: Reported on 01/22/2014 10/21/13   Junius FinnerErin O'Malley, PA-C   BP 130/81 mmHg  Pulse 84  Temp(Src) 97.6 F (36.4 C) (Oral)  Resp 18  Ht 5\' 9"  (1.753 m)  Wt 145 lb (65.772 kg)  BMI 21.40 kg/m2  SpO2 100%  Vital signs normal   Physical Exam  Constitutional:  He is oriented to person, place, and time. He appears well-developed and well-nourished.  Non-toxic appearance. He does not appear ill. No distress.  HENT:  Head: Normocephalic and atraumatic.  Right Ear: External ear normal.  Left Ear: External ear normal.  Nose: Nose normal. No mucosal edema or rhinorrhea.  Mouth/Throat: Oropharynx is clear and moist and mucous membranes are normal. No dental abscesses or uvula swelling.  Eyes: Conjunctivae and EOM are normal. Pupils are equal, round, and reactive to light.  Neck: Normal range of motion and full passive range of motion without pain. Neck supple.  Cardiovascular: Normal rate, regular rhythm and normal heart sounds.  Exam reveals no gallop and no friction rub.   No murmur heard. Pulmonary/Chest: Effort normal and breath sounds normal. No respiratory distress. He has no wheezes. He has no rhonchi. He has no rales. He exhibits no tenderness and no crepitus.  Abdominal: Soft. Normal appearance and bowel sounds are normal. He exhibits no distension. There is no tenderness. There is no rebound and no guarding.  Musculoskeletal: Normal range of motion. He exhibits no edema or tenderness.  Moves all extremities well.   Neurological: He is alert and oriented to person, place, and time. He has normal strength. No cranial nerve deficit.  Skin: Skin is warm, dry and intact. No rash noted. No erythema. No pallor.  Psychiatric: His speech is normal and behavior is normal.  His mood appears anxious.  Nursing note and vitals reviewed.            ED Course  Procedures (including critical care time)   Medications  0.9 %  sodium chloride infusion (125 mL/hr Intravenous New Bag/Given 01/22/14 0552)  diphenhydrAMINE (BENADRYL) injection 25 mg (25 mg Intravenous Given 01/22/14 0551)  famotidine (PEPCID) IVPB 20 mg (20 mg Intravenous New Bag/Given 01/22/14 0551)  methylPREDNISolone sodium succinate (SOLU-MEDROL) 125 mg/2 mL injection 125 mg (125 mg  Intravenous Given 01/22/14 0551)    Recheck at 06:45 pt states his itching is gone.  When I ask him if I need to check his wrist he states no.  Labs Review Results for orders placed or performed during the hospital encounter of 01/22/14  Comprehensive metabolic panel  Result Value Ref Range   Sodium 141 137 - 147 mEq/L   Potassium 4.4 3.7 - 5.3 mEq/L   Chloride 103 96 - 112 mEq/L   CO2 23 19 - 32 mEq/L   Glucose, Bld 139 (H) 70 - 99 mg/dL   BUN 9 6 - 23 mg/dL   Creatinine, Ser 1.61 0.50 - 1.35 mg/dL   Calcium 9.2 8.4 - 09.6 mg/dL   Total Protein 7.3 6.0 - 8.3 g/dL   Albumin 3.7 3.5 - 5.2 g/dL   AST 42 (H) 0 - 37 U/L   ALT 26 0 - 53 U/L   Alkaline Phosphatase 71 39 - 117 U/L   Total Bilirubin <0.2 (L) 0.3 - 1.2 mg/dL   GFR calc non Af Amer >90 >90 mL/min   GFR calc Af Amer >90 >90 mL/min   Anion gap 15 5 - 15  CBC with Differential  Result Value Ref Range   WBC 4.5 4.0 - 10.5 K/uL   RBC 5.10 4.22 - 5.81 MIL/uL   Hemoglobin 13.8 13.0 - 17.0 g/dL   HCT 04.5 40.9 - 81.1 %   MCV 88.8 78.0 - 100.0 fL   MCH 27.1 26.0 - 34.0 pg   MCHC 30.5 30.0 - 36.0 g/dL   RDW 91.4 78.2 - 95.6 %   Platelets 233 150 - 400 K/uL   Neutrophils Relative % 50 43 - 77 %   Neutro Abs 2.2 1.7 - 7.7 K/uL   Lymphocytes Relative 34 12 - 46 %   Lymphs Abs 1.5 0.7 - 4.0 K/uL   Monocytes Relative 10 3 - 12 %   Monocytes Absolute 0.5 0.1 - 1.0 K/uL   Eosinophils Relative 6 (H) 0 - 5 %   Eosinophils Absolute 0.3 0.0 - 0.7 K/uL   Basophils Relative 0 0 - 1 %   Basophils Absolute 0.0 0.0 - 0.1 K/uL  Ethanol  Result Value Ref Range   Alcohol, Ethyl (B) <11 0 - 11 mg/dL   Laboratory interpretation all normal    Imaging Review No results found.   EKG Interpretation None      MDM   Final diagnoses:  Contact dermatitis  Cellulitis of both lower extremities    New Prescriptions   CETIRIZINE HCL (ZYRTEC ALLERGY) 10 MG CAPS    Take 1 capsule (10 mg total) by mouth daily as needed (itching and  rash).   DIPHENHYDRAMINE (BENADRYL) 25 MG TABLET    Take 1 or 2 po Q 6hrs for itching   FAMOTIDINE (PEPCID) 20 MG TABLET    Take 1 tablet (20 mg total) by mouth 2 (two) times daily.   PREDNISONE (DELTASONE) 20 MG TABLET    Take 3  po QD x 3d , then 2 po QD x 3d then 1 po QD x 3d   SULFAMETHOXAZOLE-TRIMETHOPRIM (BACTRIM DS,SEPTRA DS) 800-160 MG PER TABLET    Take 1 tablet by mouth 2 (two) times daily.    Plan discharge  Devoria AlbeIva Jackelynn Hosie, MD, [facility]     Teauna Dubach L Astryd Pearcy, MD 01/22/14 [id number]  Ward GivensIva L Cheney Ewart, MD 01/22/14 [phone]

## 2014-01-22 NOTE — Discharge Instructions (Signed)
Stay cool, he will make the rash worse. Take the medications as prescribed. Be sure to take the antibiotic until gone. Take the prednisone and Pepcid until gone. Use the Zyrtec and Benadryl for itching. Recheck if you get a fever, the wound started draining pus, get worsening swelling or redness in the rash.

## 2014-01-22 NOTE — ED Notes (Signed)
Bed: WA24 Expected date:  Expected time:  Means of arrival:  Comments: EMS 

## 2014-01-22 NOTE — ED Notes (Signed)
Awake. Verbally responsive. A/O x4. Resp even and unlabored. No audible adventitious breath sounds noted. ABC's intact.  

## 2014-01-22 NOTE — ED Notes (Signed)
Pt refusing to give urine specimen. MD made aware without new orders note.

## 2014-01-22 NOTE — ED Notes (Signed)
Pt given 2 sandwiches to eat and drink. Pt ate 100% of each.

## 2014-01-22 NOTE — ED Notes (Signed)
Pt arrived via EMS with c/o of dried rash on abd and bil inner thighs, deformity of lt 4th finger, and rt wrist pain. Pt reported of falling and injurying lt finger and rt wrist. Pt noted having visual and audible hallucinations.

## 2014-01-28 ENCOUNTER — Encounter (HOSPITAL_COMMUNITY): Payer: Self-pay | Admitting: Emergency Medicine

## 2014-01-28 ENCOUNTER — Emergency Department (HOSPITAL_COMMUNITY)
Admission: EM | Admit: 2014-01-28 | Discharge: 2014-01-28 | Disposition: A | Discharge status: Home / Self Care | Payer: Self-pay | Attending: Emergency Medicine | Admitting: Emergency Medicine

## 2014-01-28 DIAGNOSIS — Z880 Allergy status to penicillin: Secondary | ICD-10-CM | POA: Insufficient documentation

## 2014-01-28 DIAGNOSIS — E119 Type 2 diabetes mellitus without complications: Secondary | ICD-10-CM | POA: Insufficient documentation

## 2014-01-28 DIAGNOSIS — Z5900 Homelessness unspecified: Secondary | ICD-10-CM

## 2014-01-28 DIAGNOSIS — Z720 Tobacco use: Secondary | ICD-10-CM | POA: Insufficient documentation

## 2014-01-28 DIAGNOSIS — Z8659 Personal history of other mental and behavioral disorders: Secondary | ICD-10-CM | POA: Insufficient documentation

## 2014-01-28 DIAGNOSIS — M79671 Pain in right foot: Secondary | ICD-10-CM | POA: Insufficient documentation

## 2014-01-28 NOTE — ED Notes (Signed)
Bed: WTR9 Expected date:  Expected time:  Means of arrival:  Comments: 

## 2014-01-28 NOTE — ED Notes (Signed)
Pt BIB EMS, reports bilateral foot pain with sores noted. Pt states that pain increased with walking a long distance today. Pt a&ox4, calm and cooperative in triage.

## 2014-01-28 NOTE — ED Provider Notes (Signed)
CSN: [id number]     Arrival date & time 01/28/14  0108 History   First MD Initiated Contact with Patient 01/28/14 [phone]     Chief Complaint  Patient presents with  . Foot Pain      HPI Patient presents to the emergency department complaining of bilateral foot pain.  He states his been walking on his feet for several days.  He is homeless.  He has wet socks.  He denies fevers or chills.  No spreading redness.  He denies numbness or weakness of his legs.  No other significant complaint.  Symptoms are mild in severity    Past Medical History  Diagnosis Date  . Diabetes mellitus   . Bipolar 1 disorder   . Schizophrenic disorder   . Homelessness    History reviewed. No pertinent past surgical history. History reviewed. No pertinent family history. History  Substance Use Topics  . Smoking status: Current Every Day Smoker -- 1.00 packs/day  . Smokeless tobacco: Not on file  . Alcohol Use: Yes     Comment: occ    Review of Systems  All other systems reviewed and are negative.     Allergies  Other and Penicillins cross reactors  Home Medications   Prior to Admission medications   Medication Sig Start Date End Date Taking? Authorizing Provider  Cetirizine HCl (ZYRTEC ALLERGY) 10 MG CAPS Take 1 capsule (10 mg total) by mouth daily as needed (itching and rash). Patient not taking: Reported on 01/28/2014 01/22/14   Ward GivensIva L Knapp, MD  diphenhydrAMINE (BENADRYL) 25 MG tablet Take 1 or 2 po Q 6hrs for itching Patient not taking: Reported on 01/28/2014 01/22/14   Ward GivensIva L Knapp, MD  famotidine (PEPCID) 20 MG tablet Take 1 tablet (20 mg total) by mouth 2 (two) times daily. Patient not taking: Reported on 01/28/2014 01/22/14   Ward GivensIva L Knapp, MD  meloxicam (MOBIC) 7.5 MG tablet Take 1 tablet (7.5 mg total) by mouth daily. Patient not taking: Reported on 01/22/2014 10/21/13   Junius FinnerErin O'Malley, PA-C  predniSONE (DELTASONE) 20 MG tablet Take 3 po QD x 3d , then 2 po QD x 3d then 1 po QD x  3d Patient not taking: Reported on 01/28/2014 01/22/14   Ward GivensIva L Knapp, MD  sulfamethoxazole-trimethoprim (BACTRIM DS,SEPTRA DS) 800-160 MG per tablet Take 1 tablet by mouth 2 (two) times daily. Patient not taking: Reported on 01/28/2014 01/22/14   Ward GivensIva L Knapp, MD  traMADol (ULTRAM) 50 MG tablet Take 1 tablet (50 mg total) by mouth every 6 (six) hours as needed. Patient not taking: Reported on 01/22/2014 10/21/13   Junius FinnerErin O'Malley, PA-C   BP 162/105 mmHg  Pulse 90  Temp(Src) 98.4 F (36.9 C) (Oral)  Resp 20  Wt 140 lb (63.504 kg)  SpO2 100% Physical Exam  Constitutional: He is oriented to person, place, and time. He appears well-developed and well-nourished.  HENT:  Head: Normocephalic.  Eyes: EOM are normal.  Neck: Normal range of motion.  Pulmonary/Chest: Effort normal.  Abdominal: He exhibits no distension.  Musculoskeletal: Normal range of motion.  Normal PT and DP pulses bilaterally.  Feet are dirty but without erythema or warmth.  No focal fluctuance.  Mild dry feet with cracked soles.  No significant foul odor.  Neurological: He is alert and oriented to person, place, and time.  Psychiatric: He has a normal mood and affect.  Nursing note and vitals reviewed.   ED Course  Procedures (including critical care time) Labs Review Labs  Reviewed - No data to display  Imaging Review No results found.   EKG Interpretation None      MDM   Final diagnoses:  Homeless    No signs of infection.  Recommended warm water soaks and dry socks.    Lyanne CoKevin M Yakir Wenke, MD 01/28/14 [phone]

## 2014-01-28 NOTE — ED Notes (Signed)
Patient with c/o bilateral foot pain due to walking Patient sleeping when this nurse entered room to assess Patient extremely unkept, disheveled and very malodorous Patient in NAD

## 2014-01-28 NOTE — Discharge Instructions (Signed)
°Emergency Department Resource Guide °1) Find a Doctor and Pay Out of Pocket °Although you won't have to find out who is covered by your insurance plan, it is a good idea to ask around and get recommendations. You will then need to call the office and see if the doctor you have chosen will accept you as a new patient and what types of options they offer for patients who are self-pay. Some doctors offer discounts or will set up payment plans for their patients who do not have insurance, but you will need to ask so you aren't surprised when you get to your appointment. ° °2) Contact Your Local Health Department °Not all health departments have doctors that can see patients for sick visits, but many do, so it is worth a call to see if yours does. If you don't know where your local health department is, you can check in your phone book. The CDC also has a tool to help you locate your state's health department, and many state websites also have listings of all of their local health departments. ° °3) Find a Walk-in Clinic °If your illness is not likely to be very severe or complicated, you may want to try a walk in clinic. These are popping up all over the country in pharmacies, drugstores, and shopping centers. They're usually staffed by nurse practitioners or physician assistants that have been trained to treat common illnesses and complaints. They're usually fairly quick and inexpensive. However, if you have serious medical issues or chronic medical problems, these are probably not your best option. ° °No Primary Care Doctor: °- Call [facility] at  832-8000 - they can help you locate a primary care doctor that  accepts your insurance, provides certain services, etc. °- Physician Referral Service- 1-800-533-3463 ° °Chronic Pain Problems: °Organization         Address  Phone   Notes  °[facility]  (336) 297-2271 Patients need to be referred by their primary care doctor.  ° °Medication  Assistance: °Organization         Address  Phone   Notes  °[place] [facility] [address] °[place], [place] [address] (336) 641-8030 --Must be a resident of [place] °-- Must have NO insurance coverage whatsoever (no Medicaid/ Medicare, etc.) °-- The pt. MUST have a primary care doctor that directs their care regularly and follows them in the community °  °[facility]  (866) 331-1348   °[employer]  (888) 892-1162   ° °Agencies that provide inexpensive medical care: °Organization         Address  Phone   Notes  °[facility]  (336) 832-8035   °[facility]    (336) 832-7272   °[facility] [address] °[place], [place] [address] (336) 832-4777   °[facility] [address], °[place] (336) 271-4999   °[facility]    (336) 373-0678   °[facility]    (336) 272-1050   °[facility] ° [address], [place] Phone:  (336) 832-4444, Fax:  (336) 832-4440 Hours of Operation:  9 am - 6 pm, M-F.  Also accepts Medicaid/Medicare and self-pay.  °[facility] ° [address],  Phone: (336) 832-3150, Fax: (336) 832-3151. Hours of Operation:  8:30 am - 5:30 pm, M-F.  Also accepts Medicaid and self-pay.  °[facility] [place] [address], [place] Phone: (336) 878-6027   °[facility] [address], [place], [place] (336)723-1848, Ext. 123 Mondays & Thursdays: 7-9 AM.  First 15 patients are seen on a first come, first serve basis. °  ° °Medicaid-accepting [place] Providers: ° °Organization         Address  Phone   Notes  °[facility] [address], [place] (336) 641-2100 Also accepts self-pay patients.  °[facility] [address], [place] ° (336) 856-9996   °[facility] [address], [place]  (336) 288-8857   °[facility] [address] [place] [address], [place] (336) 299-7000   °Veita Bland [address], [place]  ° (336) 373-1557 Only accepts [place] Access Medicaid patients after they have their name applied to their card.  ° °Self-Pay (no insurance) in [place]: ° °Organization         Address  Phone   Notes  °Sickle Cell Patients, [facility] [address], [place] (336) 832-1970   °[facility] [address], [place] (336) 832-4400   °[facility] ° [address], [place] (336) 992-4800   °[facility]/Dr. Osei-Bonsu ° [address] [place] [address], [place] or [address], [place] (336) 841-8500 Phone number for both [place] and [place] locations is the same.  °[facility] [address], [place] (336) 299-0000   °[facility] [address] [place] [address], [place] or [address] (336) 852-7530 °(336) 878-2260   °[facility] [address], [place] (336) 350-1642, phone; (336) 294-5005, fax Sees patients 1st and 3rd Saturday of every month.  Must not qualify for public or private insurance (i.e. Medicaid, Medicare, [place] Health Choice, Veterans' Benefits) • Household income should be no more than 200% of the poverty level •The clinic cannot treat you if you are pregnant or think you are pregnant • Sexually transmitted diseases are not treated at the clinic.  ° ° °Dental Care: °Organization         Address  Phone  Notes  °[place] [facility] [address], [place] (336) 641-6152 Accepts children up to age 21 who are enrolled in Medicaid or [place] Health Choice; pregnant women with a Medicaid card; and children who have applied for Medicaid or [place] Health Choice, but were declined, whose parents can pay a reduced fee at time of service.  °[place]  [facility] [place]  [address], [place] (336) 641-7733 Accepts children up to age 21 who are enrolled in Medicaid or [place] Health Choice; pregnant women with a Medicaid card; and children who have applied for Medicaid or  Health Choice, but were declined, whose parents can pay a reduced fee at time of service.  °[facility] ° [address], [place] (336) 641-4533 Patients are seen by appointment only. Walk-ins are not accepted. [facility] will see patients 18 years of age and older. °Monday - Tuesday (8am-5pm) °Most Wednesdays (8:30-5pm) °$30 per visit, cash only  °[facility] ° [address], [place] (336) 641-4533 Patients are seen by appointment only. Walk-ins are not accepted. [facility] will see patients 18 years of age and older. °One   Wednesday Evening (Monthly: Volunteer Based).  $30 per visit, cash only  °[facility]  (919) 537-3737 for adults; Children under age 4, call Graduate Pediatric Dentistry at (919) [phone]. Children aged 4-14, please call (919) [phone] to request a pediatric application. ° Dental services are provided in all areas of dental care including fillings, crowns and bridges, complete and partial dentures, implants, gum treatment, root canals, and extractions. Preventive care is also provided. Treatment is provided to both adults and children. °Patients are selected via a lottery and there is often a waiting list. °  °[facility] [address], °[place] ° (336) 763-8833 [email or web address] °  °[facility] [address], [place], [place] (336)723-1848, Ext. 123 Second and Fourth Thursday of each month, opens at 6:30 AM; Clinic ends at 9 AM.  Patients are seen on a first-come first-served basis, and a limited number are seen during each clinic.  ° °[facility] ° [address], [place], [place] (336) 723-7904    Eligibility Requirements °You must have lived in [place], [place], or [place] counties for at least the last three months. °  You cannot be eligible for state or federal sponsored healthcare insurance, including Veterans Administration, Medicaid, or Medicare. °  You generally cannot be eligible for healthcare insurance through your employer.  °  How to apply: °Eligibility screenings are held every Tuesday and Wednesday afternoon from 1:00 pm until 4:00 pm. You do not need an appointment for the interview!  °[facility] [address], [place], [place] 336-631-2330   °[facility]  336-342-8273   °[facility]  336-703-3100   °[facility]  336-570-6415   ° °Behavioral Health Resources in the Community: °Intensive Outpatient Programs °Organization         Address  Phone  Notes  °[facility] [address], [place], [place] 336-878-6098   °[facility] [address], [place], [place] 336-832-9800   °[facility] [address], [place], [place] ° 336-882-2125   °[facility] [address],  °[place] 1-800-853-5163 or 336-641-4981   °Substance Abuse Resources °Organization         Address  Phone  Notes  °[facility]  336-882-2125   °[facility]  336-784-9470   °[facility]  336-285-9073   °[facility]  336-845-3988   °Residential & Outpatient Substance Abuse Program  1-800-659-3381   °Psychological Services °Organization         Address  Phone  Notes  °[facility]  336- 832-9600   °[facility]  336- 378-7881   °[facility] [address], [place] 1-800-853-5163 or 336-641-4981   ° °Mobile Crisis Teams °Organization         Address  Phone  Notes  °[facility], Mobile Crisis Care Unit  1-877-626-1772   °Assertive °[facility] ° [address]  [place], [place] 336-834-9664   °Sharon DeEsch [address] °[place] 336-554-5454   ° °Self-Help/Support Groups °Organization         Address  Phone             Notes  °[facility] [place] - variety of support groups  336- 373-1402 Call for more information  °Narcotics Anonymous (NA), [facility] [address], °[place]   2 meetings at this location  ° °  Residential Treatment Programs °Organization         Address  Phone  Notes  °[facility] [address],    °[place]  1-866-801-8205   °[facility] ° [address], [place], [place] 704-293-8524   °[facility] [address], [place] 336-845-3988 Admissions: 8am-3pm M-F  °[facility] [address]    °[place], [place] 336-841-1104   °[facility] [address], [place], [place] 336-379-7146   °[facility] [address]  °[place], [place] 336-285-9073   °[facility] - Intensive Outpatient [address], [place], [place] 336-852-3033   °[facility] [address]  °[place], [place] 1-877-615-2722 or 336-784-9470   °[facility] [address] [place], [place] 336-227-7417 Accepts Medicaid  °[facility] [address]  °[place] 1-800-659-3381 Substance Abuse/Addiction Treatment  ° °[place] Behavioral Health Resources °Organization         Address  Phone  Notes  °[facility]  (888) 581-9988   °Julie Brannon, PhD [address] [place], [place]   (336) 349-5553 or (336) 951-0000   °[facility]   [address] °[place], [place] (336) 349-4454   °[facility] [address], [place], [place] (336) 342-8316 Insurance/Medicaid/sponsorship through [facility]  °[facility] [address]                                    [place], [place] (336) 342-8316 Therapy/tele-psych/case    °[facility] [address]  ° [place], [place] (336) 349-2233    °Dr. Arfeen  (336) 349-4544   °[facility] [place]  [employer] [place] [facility] 1) [address], [place] °2) [address], [place] °3)  [address], [place] (336) 349-3220 °(336) 342-7768 ° °(336) 342-8140   °[place] [facility] (336) 342-1394 or (336) 342-3537 (After Hours)    ° ° °

## 2018-07-26 ENCOUNTER — Other Ambulatory Visit: Payer: Self-pay | Admitting: Internal Medicine

## 2018-07-26 DIAGNOSIS — Z20822 Contact with and (suspected) exposure to covid-19: Secondary | ICD-10-CM

## 2018-07-29 ENCOUNTER — Other Ambulatory Visit: Payer: Self-pay

## 2018-07-29 ENCOUNTER — Encounter (HOSPITAL_COMMUNITY): Payer: Self-pay | Admitting: Emergency Medicine

## 2018-07-29 ENCOUNTER — Emergency Department (HOSPITAL_COMMUNITY)
Admission: EM | Admit: 2018-07-29 | Discharge: 2018-07-29 | Disposition: A | Discharge status: Home / Self Care | Payer: Self-pay | Attending: Emergency Medicine | Admitting: Emergency Medicine

## 2018-07-29 DIAGNOSIS — F17210 Nicotine dependence, cigarettes, uncomplicated: Secondary | ICD-10-CM | POA: Insufficient documentation

## 2018-07-29 DIAGNOSIS — M79672 Pain in left foot: Secondary | ICD-10-CM | POA: Insufficient documentation

## 2018-07-29 DIAGNOSIS — F1210 Cannabis abuse, uncomplicated: Secondary | ICD-10-CM | POA: Insufficient documentation

## 2018-07-29 DIAGNOSIS — M79671 Pain in right foot: Secondary | ICD-10-CM

## 2018-07-29 DIAGNOSIS — E119 Type 2 diabetes mellitus without complications: Secondary | ICD-10-CM | POA: Insufficient documentation

## 2018-07-29 LAB — NOVEL CORONAVIRUS, NAA: SARS-CoV-2, NAA: NOT DETECTED

## 2018-07-29 MED ORDER — ACETAMINOPHEN 500 MG PO TABS
1000.0000 mg | ORAL_TABLET | Freq: Once | ORAL | Status: AC
  Administered 2018-07-29: 1000 mg via ORAL
  Filled 2018-07-29: qty 2

## 2018-07-29 NOTE — ED Triage Notes (Signed)
Patient is from the street, having bilateral foot pain and some swelling to his feet.  He would like something to eat.

## 2018-07-29 NOTE — ED Provider Notes (Signed)
[facility]   CSN: [id number] Arrival date & time: 07/29/18  0302    History   Chief Complaint Chief Complaint  Patient presents with  . Foot Pain    HPI Stephen Hutchinson is a 33 y.o. male.     33 y/o male with history of diabetes, bipolar 1 disorder, schizophrenia, homelessness presents to the ED from the street.  States that both of his feet have been hurting.  He has been walking excessively over the past 2 days.  Denies any trauma or injury.  Triage note references some swelling to bilateral feet; however, patient states that he has not looked at his feet at all until my assessment.  He has not taken any medications for pain.  Is complaining of hunger and is requesting something to eat.  The history is provided by the patient. No language interpreter was used.  Foot Pain    Past Medical History:  Diagnosis Date  . Bipolar 1 disorder (HCC)   . Diabetes mellitus   . Homelessness   . Schizophrenic disorder [facility](HCC)     Patient Active Problem List   Diagnosis Date Noted  . Polysubstance abuse (HCC) 08/22/2012  . Elevated troponin 08/22/2012  . Cocaine abuse (HCC) 08/22/2012  . Altered mental status 08/22/2012    History reviewed. No pertinent surgical history.      Home Medications    Prior to Admission medications   Medication Sig Start Date End Date Taking? Authorizing Provider  Cetirizine HCl (ZYRTEC ALLERGY) 10 MG CAPS Take 1 capsule (10 mg total) by mouth daily as needed (itching and rash). Patient not taking: Reported on 01/28/2014 01/22/14   Devoria AlbeKnapp, Iva, MD  diphenhydrAMINE (BENADRYL) 25 MG tablet Take 1 or 2 po Q 6hrs for itching Patient not taking: Reported on 01/28/2014 01/22/14   Devoria AlbeKnapp, Iva, MD  famotidine (PEPCID) 20 MG tablet Take 1 tablet (20 mg total) by mouth 2 (two) times daily. Patient not taking: Reported on 01/28/2014 01/22/14   Devoria AlbeKnapp, Iva, MD  meloxicam (MOBIC) 7.5 MG tablet Take 1 tablet  (7.5 mg total) by mouth daily. Patient not taking: Reported on 01/22/2014 10/21/13   Lurene ShadowPhelps, Erin O, PA-C  predniSONE (DELTASONE) 20 MG tablet Take 3 po QD x 3d , then 2 po QD x 3d then 1 po QD x 3d Patient not taking: Reported on 01/28/2014 01/22/14   Devoria AlbeKnapp, Iva, MD  sulfamethoxazole-trimethoprim (BACTRIM DS,SEPTRA DS) 800-160 MG per tablet Take 1 tablet by mouth 2 (two) times daily. Patient not taking: Reported on 01/28/2014 01/22/14   Devoria AlbeKnapp, Iva, MD  traMADol (ULTRAM) 50 MG tablet Take 1 tablet (50 mg total) by mouth every 6 (six) hours as needed. Patient not taking: Reported on 01/22/2014 10/21/13   Rolla PlatePhelps, Erin O, PA-C    Family History History reviewed. No pertinent family history.  Social History Social History   Tobacco Use  . Smoking status: Current Every Day Smoker    Packs/day: 1.00  . Smokeless tobacco: Never Used  Substance Use Topics  . Alcohol use: Yes    Comment: occ  . Drug use: Yes    Types: Marijuana     Allergies   Other and Penicillins cross reactors   Review of Systems Review of Systems Ten systems reviewed and are negative for acute change, except as noted in the HPI.    Physical Exam Updated Vital Signs BP 127/81 (BP Location: Right Arm)   Pulse 73   Temp 98.6 F (  37 C) (Oral)   Resp 16   SpO2 99%   Physical Exam Vitals signs and nursing note reviewed.  Constitutional:      General: He is not in acute distress.    Appearance: He is well-developed. He is not diaphoretic.     Comments: Nontoxic appearing, pleasant. Smells as if he has not bathed in quite a while, unkept.  HENT:     Head: Normocephalic and atraumatic.  Eyes:     General: No scleral icterus.    Conjunctiva/sclera: Conjunctivae normal.  Neck:     Musculoskeletal: Normal range of motion.  Cardiovascular:     Rate and Rhythm: Normal rate and regular rhythm.     Pulses: Normal pulses.     Comments: Capillary refill brisk in all digits of bilateral feet. Pulmonary:      Effort: Pulmonary effort is normal. No respiratory distress.     Comments: Respirations even and unlabored Skin:    General: Skin is warm and dry.     Findings: No erythema or rash.     Comments: Areas of skin maceration to feet. Feet scattered with corns, calluses. Some areas of pallor to skin, primarily of b/l great toes. No erythema, heat to touch, lymphangitic streaking.  Neurological:     Mental Status: He is alert and oriented to person, place, and time.     Comments: Sensation to light touch intact in BLE. Able to wiggle all toes.  Psychiatric:        Behavior: Behavior normal.      ED Treatments / Results  Labs (all labs ordered are listed, but only abnormal results are displayed) Labs Reviewed - No data to display  EKG None  Radiology No results found.  Procedures Procedures (including critical care time)  Medications Ordered in ED Medications  acetaminophen (TYLENOL) tablet 1,000 mg (1,000 mg Oral Given 07/29/18 0404)     4:37 AM Improved coloration to toes after being kept out of shoes and patient applied socks.  Remains neurovascularly intact.   Initial Impression / Assessment and Plan / ED Course  I have reviewed the triage vital signs and the nursing notes.  Pertinent labs & imaging results that were available during my care of the patient were reviewed by me and considered in my medical decision making (see chart for details).        33 year old male, homeless, presents to the emergency department for bilateral foot pain.  He has been walking frequently.  Noted to have corns and calluses.  Some pallor to distal areas of digits.  Coloration has improved after application of socks.  He has been fed in the emergency department.  Pain improving with Tylenol.  No swelling, erythema, heat to touch.  No concern for secondary infection or cellulitis.  Counseled on proper hygiene and foot care.  Patient discharged in stable condition.     Final Clinical  Impressions(s) / ED Diagnoses   Final diagnoses:  Foot pain, bilateral    ED Discharge Orders    None       Antonietta Breach, PA-C 07/29/18 0459    Merrily Pew, MD 07/29/18 [phone]

## 2018-07-29 NOTE — ED Notes (Signed)
Pt reports bilateral foot pain while walking. Pt reports homelessness and reports he has been doing more walking then usual lately.

## 2018-07-29 NOTE — ED Notes (Signed)
Patient verbalizes understanding of discharge instructions. Opportunity for questioning and answers were provided. Armband removed by staff, pt discharged from ED back to the streets via foot. Pt given (2) additional sandwich bags, cheese, and copious amounts of crackers along with (2) sodas. Pt satisfied.

## 2019-02-04 ENCOUNTER — Other Ambulatory Visit: Payer: Self-pay

## 2019-02-04 ENCOUNTER — Encounter (HOSPITAL_COMMUNITY): Payer: Self-pay | Admitting: Emergency Medicine

## 2019-02-04 ENCOUNTER — Emergency Department (HOSPITAL_COMMUNITY)
Admission: EM | Admit: 2019-02-04 | Discharge: 2019-02-04 | Disposition: A | Discharge status: Home / Self Care | Payer: Self-pay | Attending: Emergency Medicine | Admitting: Emergency Medicine

## 2019-02-04 DIAGNOSIS — E119 Type 2 diabetes mellitus without complications: Secondary | ICD-10-CM | POA: Insufficient documentation

## 2019-02-04 DIAGNOSIS — F17210 Nicotine dependence, cigarettes, uncomplicated: Secondary | ICD-10-CM | POA: Insufficient documentation

## 2019-02-04 DIAGNOSIS — Z5900 Homelessness unspecified: Secondary | ICD-10-CM

## 2019-02-04 DIAGNOSIS — F319 Bipolar disorder, unspecified: Secondary | ICD-10-CM | POA: Insufficient documentation

## 2019-02-04 NOTE — ED Provider Notes (Addendum)
[facility] EMERGENCY DEPARTMENT Provider Note   CSN: [id number] Arrival date & time: 02/04/19  5329     History Chief Complaint  Patient presents with  . Needs a Warm Place to stay ( homeless)    Eual Lindstrom is a 34 y.o. male.  HPI   34 year old male with history of bipolar 1 disorder, diabetes, homelessness, schizophrenic disorder, who presents the emergency department today via EMS from the street requesting a warm place to stay.  States he is cold because he has been outside all night as he is homeless.  Denies any symptoms or other complaints currently.  Past Medical History:  Diagnosis Date  . Bipolar 1 disorder (HCC)   . Diabetes mellitus   . Homelessness   . Schizophrenic disorder [facility])     Patient Active Problem List   Diagnosis Date Noted  . Polysubstance abuse (HCC) 08/22/2012  . Elevated troponin 08/22/2012  . Cocaine abuse (HCC) 08/22/2012  . Altered mental status 08/22/2012    History reviewed. No pertinent surgical history.     No family history on file.  Social History   Tobacco Use  . Smoking status: Current Every Day Smoker    Packs/day: 1.00  . Smokeless tobacco: Never Used  Substance Use Topics  . Alcohol use: Yes    Comment: occ  . Drug use: Yes    Types: Marijuana    Home Medications Prior to Admission medications   Medication Sig Start Date End Date Taking? Authorizing Provider  Cetirizine HCl (ZYRTEC ALLERGY) 10 MG CAPS Take 1 capsule (10 mg total) by mouth daily as needed (itching and rash). Patient not taking: Reported on 01/28/2014 01/22/14   Devoria Albe, MD  diphenhydrAMINE (BENADRYL) 25 MG tablet Take 1 or 2 po Q 6hrs for itching Patient not taking: Reported on 01/28/2014 01/22/14   Devoria Albe, MD  famotidine (PEPCID) 20 MG tablet Take 1 tablet (20 mg total) by mouth 2 (two) times daily. Patient not taking: Reported on 01/28/2014 01/22/14   Devoria Albe, MD  meloxicam (MOBIC) 7.5 MG tablet Take 1 tablet  (7.5 mg total) by mouth daily. Patient not taking: Reported on 01/22/2014 10/21/13   Lurene Shadow, PA-C  predniSONE (DELTASONE) 20 MG tablet Take 3 po QD x 3d , then 2 po QD x 3d then 1 po QD x 3d Patient not taking: Reported on 01/28/2014 01/22/14   Devoria Albe, MD  sulfamethoxazole-trimethoprim (BACTRIM DS,SEPTRA DS) 800-160 MG per tablet Take 1 tablet by mouth 2 (two) times daily. Patient not taking: Reported on 01/28/2014 01/22/14   Devoria Albe, MD  traMADol (ULTRAM) 50 MG tablet Take 1 tablet (50 mg total) by mouth every 6 (six) hours as needed. Patient not taking: Reported on 01/22/2014 10/21/13   Lurene Shadow, PA-C    Allergies    Other and Penicillins cross reactors  Review of Systems   Review of Systems  Constitutional: Negative for chills and fever.       Cold after being outside all night  HENT: Negative for sore throat.   Eyes: Negative for visual disturbance.  Respiratory: Negative for cough and shortness of breath.   Cardiovascular: Negative for chest pain.  Gastrointestinal: Negative for abdominal pain, nausea and vomiting.  Genitourinary: Negative for dysuria.  Musculoskeletal: Negative for back pain.  Skin: Negative for rash.  All other systems reviewed and are negative.   Physical Exam Updated Vital Signs BP (!) 158/74 (BP Location: Left Arm)   Pulse 98  Temp (!) 97.2 F (36.2 C) (Oral)   Resp 18   SpO2 96%   Physical Exam Vitals and nursing note reviewed.  Constitutional:      Appearance: He is well-developed.  HENT:     Head: Normocephalic and atraumatic.  Eyes:     Conjunctiva/sclera: Conjunctivae normal.  Cardiovascular:     Rate and Rhythm: Normal rate and regular rhythm.  Pulmonary:     Effort: Pulmonary effort is normal.     Breath sounds: Normal breath sounds.  Abdominal:     General: Abdomen is flat.  Musculoskeletal:     Cervical back: Neck supple.  Skin:    General: Skin is warm and dry.  Neurological:     Mental Status: He is  alert.     Comments: Clear speech, moving all extremities     ED Results / Procedures / Treatments   Labs (all labs ordered are listed, but only abnormal results are displayed) Labs Reviewed - No data to display  EKG None  Radiology No results found.  Procedures Procedures (including critical care time)  Medications Ordered in ED Medications - No data to display  ED Course  I have reviewed the triage vital signs and the nursing notes.  Pertinent labs & imaging results that were available during my care of the patient were reviewed by me and considered in my medical decision making (see chart for details).    MDM Rules/Calculators/A&P                     34 year old male with history of bipolar 1 disorder, diabetes, homelessness, schizophrenic disorder, who presents the emergency department today via EMS from the street requesting a warm place to stay.  States he is cold because he has been outside all night as he is homeless.  Denies any symptoms or other complaints currently.  Patient with reassuring vital signs, nontoxic nonseptic appearing with no complaints other than feeling cold after being outside all night..  He was provided with information for shelters as well as information for the [facility].  Advised on return precautions. patient discharged in stable condition.  Final Clinical Impression(s) / ED Diagnoses Final diagnoses:  Homelessness    Rx / DC Orders ED Discharge Orders    None       Bishop Dublin 02/04/19 0757    Pattricia Boss, MD 02/09/19 1330    Pattricia Boss, MD 02/09/19 1330

## 2019-02-04 NOTE — Discharge Instructions (Signed)
Please use the resources provided to help you find a shelter and other resources you may need while suffering from homelessness.  Return to the emergency department for any new or worsening symptoms.

## 2019-02-04 NOTE — ED Triage Notes (Signed)
Patient arrived with EMS from street , reports needs a warm place to stay - he has been out in the cold all night , alert and oriented/respirations unlabored , ambulatory .

## 2019-02-04 NOTE — ED Notes (Signed)
Pt discharge instructions reviewed with the patient. The patient verbalized understanding of instructions. Pt discharged.
